# Patient Record
Sex: Female | Born: 2005 | Hispanic: Yes | Marital: Single | Smoking: Never smoker
Health system: Southern US, Community
[De-identification: ages and names within clinical notes are randomized; demographics above are authoritative.]

## PROBLEM LIST (undated history)

## (undated) HISTORY — PX: MYRINGOTOMY: SUR874

## (undated) HISTORY — PX: TONSILLECTOMY: SUR1361

---
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
## 2023-01-14 IMAGING — US US BREAST*R* LIMITED INC AXILLA
1 series · 7 of 7 positions shown · non-contrast
Comparison: None.

CLINICAL DATA: 15-year-old female presenting for evaluation of a
palpable lump in the upper-outer right breast. Her physician could
also feel the lump on clinical breast exam. She started birth
control within the past month.

EXAM:
ULTRASOUND OF THE RIGHT BREAST

[Series 1: us breast*right* limited inc axilla · 0.07mm/px · 7 of 7 slices shown]
[im 1/7]
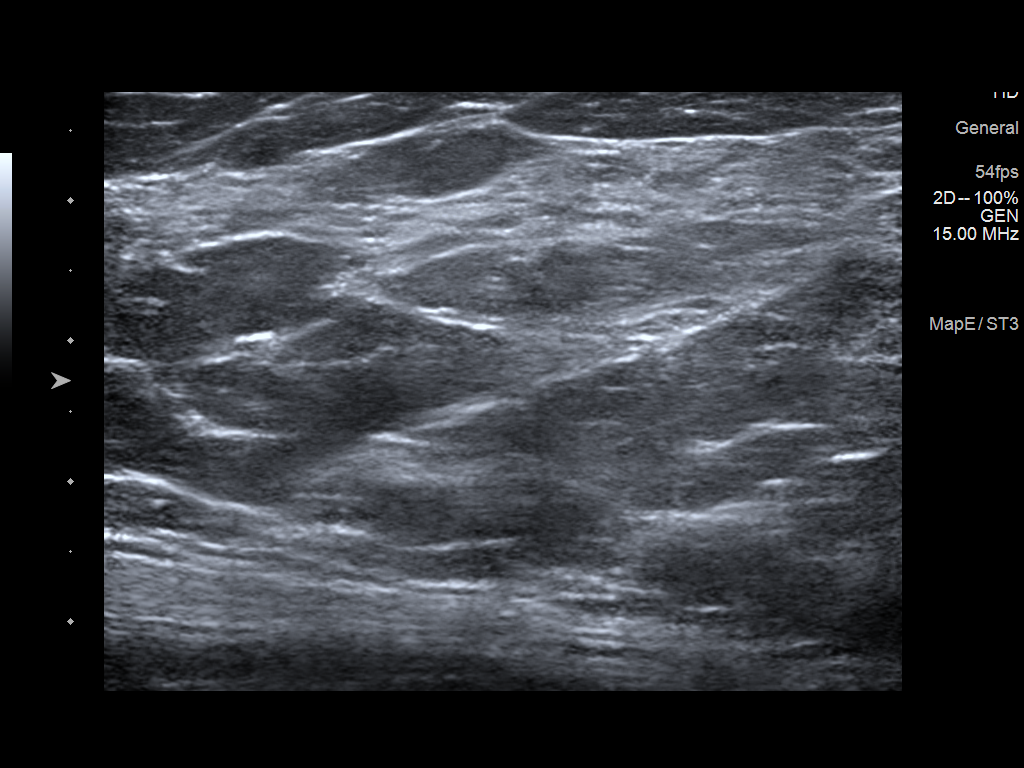
[im 2/7]
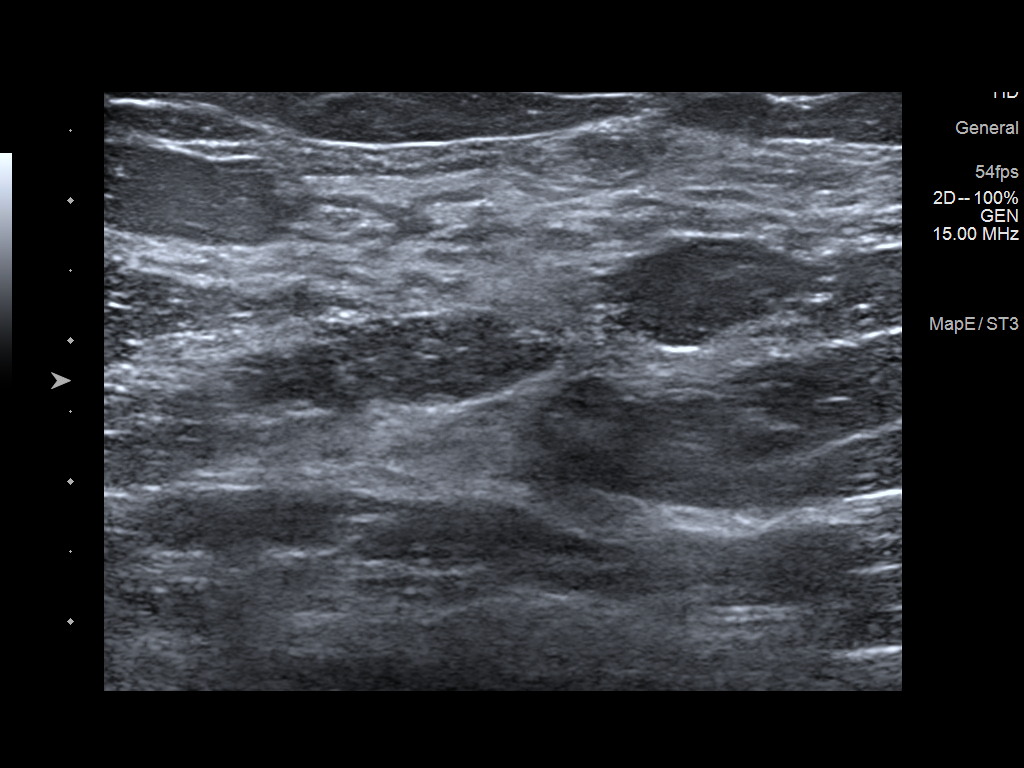
[im 3/7]
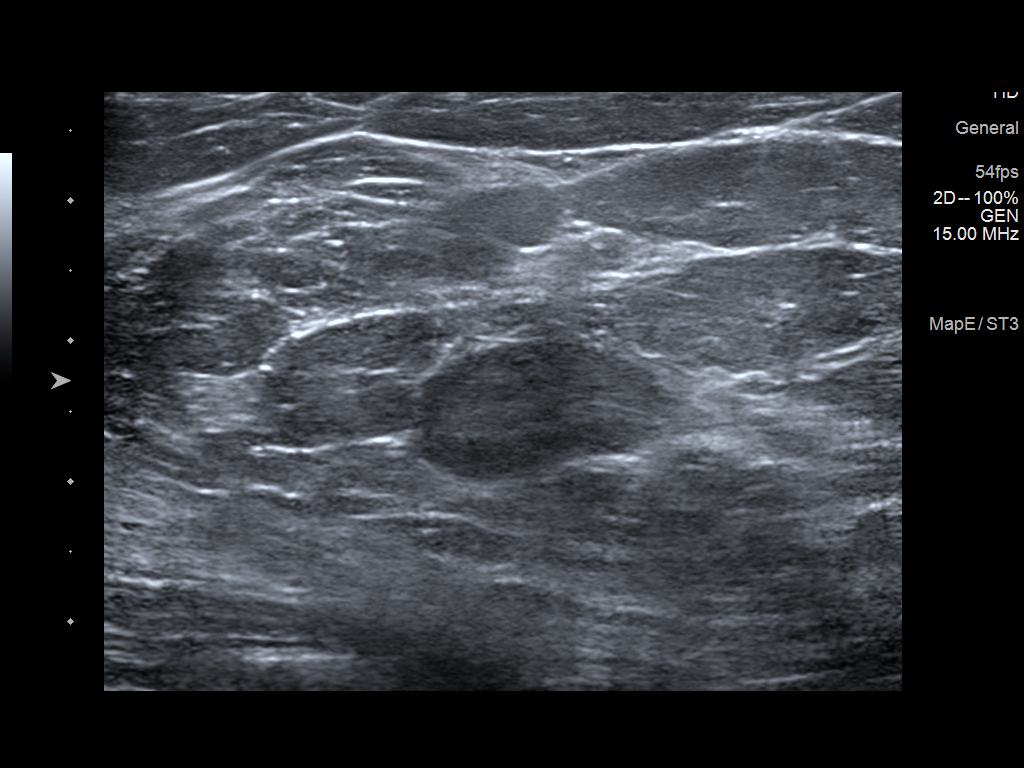
[im 4/7]
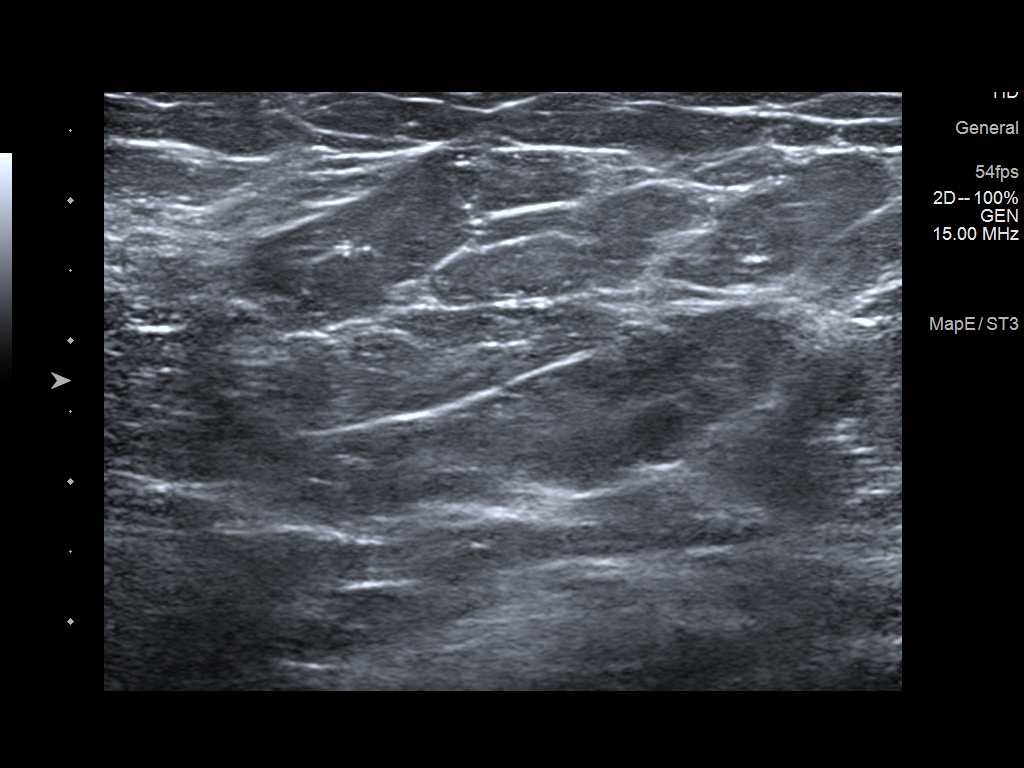
[im 5/7]
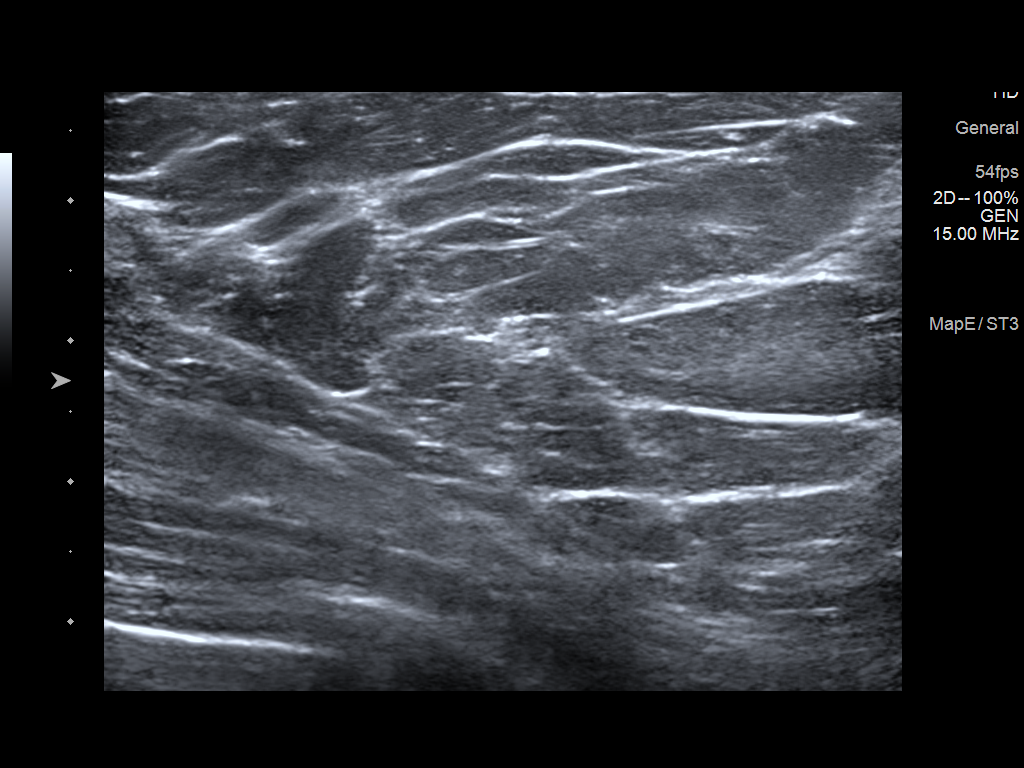
[im 6/7]
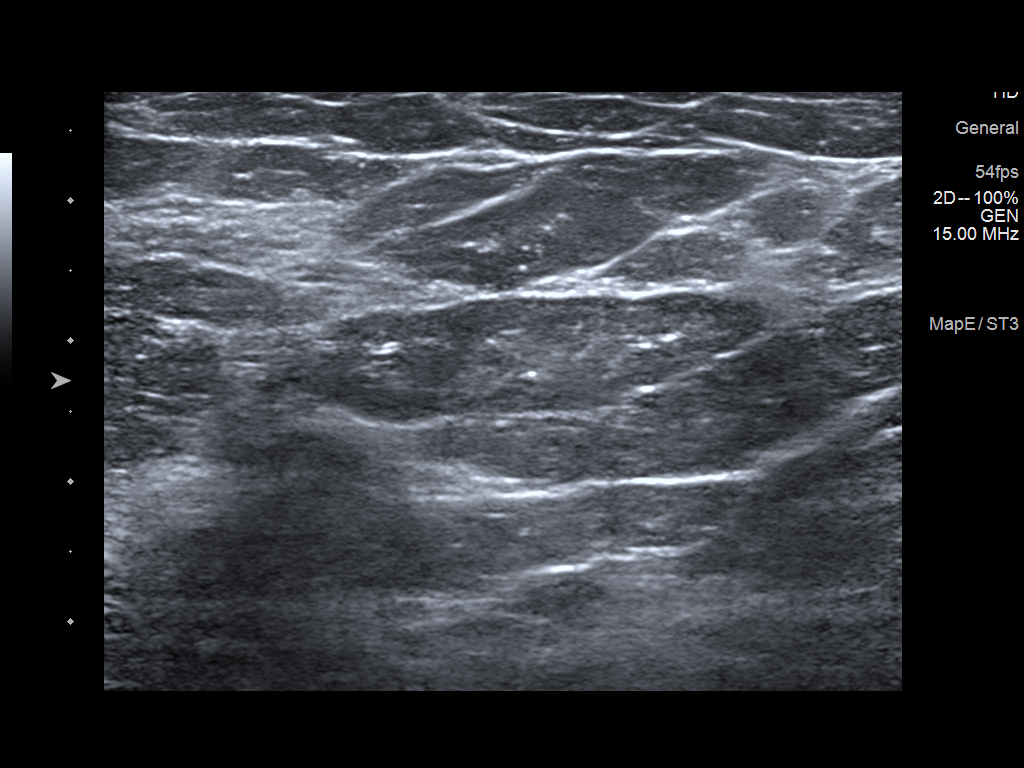
[im 7/7]
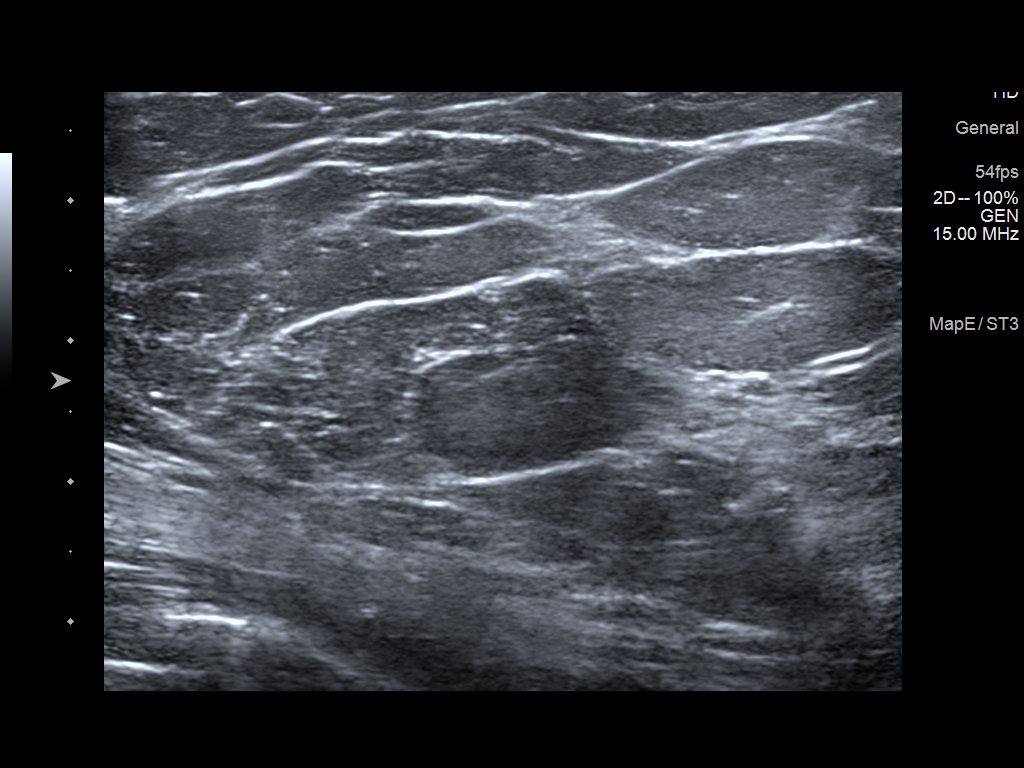

[7 of 7 positions shown; findings below may reference images not displayed]

FINDINGS: Ultrasound of the palpable lump in the upper-outer quadrant of the
right breast demonstrates normal fibroglandular tissue. No
suspicious masses or areas of shadowing are identified.
IMPRESSION: There are no targeted sonographic abnormalities at the palpable site
of concern in the upper-outer right breast.

RECOMMENDATION:
1. Clinical follow-up recommended for the palpable area of concern
in the right breast. Any further workup should be based on clinical
grounds.

2. Screening mammogram at age 40 unless there are persistent or
intervening clinical concerns. (Code:JY-T-HF5)

I have discussed the findings and recommendations with the patient.
If applicable, a reminder letter will be sent to the patient
regarding the next appointment.

BI-RADS CATEGORY  1: Negative.

## 2023-01-29 LAB — TSH: TSH: 4.93 mIU/L — ABNORMAL HIGH

## 2023-01-30 ENCOUNTER — Telehealth (INDEPENDENT_AMBULATORY_CARE_PROVIDER_SITE_OTHER): Payer: Self-pay | Admitting: Pediatrics

## 2023-01-30 ENCOUNTER — Encounter (INDEPENDENT_AMBULATORY_CARE_PROVIDER_SITE_OTHER): Payer: Self-pay | Admitting: Pediatrics

## 2023-01-30 ENCOUNTER — Ambulatory Visit (INDEPENDENT_AMBULATORY_CARE_PROVIDER_SITE_OTHER): Payer: Commercial Managed Care - PPO | Admitting: Pediatrics

## 2023-01-30 VITALS — BP 122/78 | HR 80 | Ht 64.13 in | Wt 181.7 lb

## 2023-01-30 DIAGNOSIS — E282 Polycystic ovarian syndrome: Secondary | ICD-10-CM

## 2023-01-30 DIAGNOSIS — E063 Autoimmune thyroiditis: Secondary | ICD-10-CM | POA: Diagnosis not present

## 2023-01-30 DIAGNOSIS — R197 Diarrhea, unspecified: Secondary | ICD-10-CM | POA: Diagnosis not present

## 2023-01-30 MED ORDER — LEVOTHYROXINE SODIUM 50 MCG PO TABS: 50.0000 ug | ORAL_TABLET | Freq: Every day | ORAL | 5 refills | Status: DC

## 2023-01-30 MED ORDER — DESOGESTREL-ETHINYL ESTRADIOL 0.15-30 MG-MCG PO TABS: 1.0000 | ORAL_TABLET | Freq: Every day | ORAL | 11 refills | Status: DC

## 2023-01-30 NOTE — Patient Instructions (Addendum)
Latest Reference Range & Units 07/13/22 13:58 01/28/23 15:29  TSH mIU/L 5.65 (H) 4.93 (H)  T4,Free(Direct) 0.8 - 1.4 ng/dL 1.2 1.1  (H): Data is abnormally high   Please obtain labs 2-3 weeks before the next visit or sooner if having symptoms, wait at least 6 weeks. Remember to get labs done BEFORE the dose of levothyroxine, or 6 hours AFTER the dose of levothyroxine.  [facility] is in our office Monday, Tuesday, Wednesday and Friday from 8AM-4PM, closed for lunch 12pm-1pm. On Thursday, you can go to the third floor, Pediatric Neurology office at [address] [place], [place] [address]. You do not need an appointment, as they see patients in the order they arrive.  Let the front staff know that you are here for labs, and they will help you get to the [facility] lab.

## 2023-01-30 NOTE — Assessment & Plan Note (Signed)
-  TSH rising, thyroxine normal -Increase levothyroxine daily, repeat TFTs in 6 weeks or if no symptoms 2-3 weeks before next visit

## 2023-01-30 NOTE — Assessment & Plan Note (Signed)
-  Stable -Continue apri

## 2023-01-30 NOTE — Assessment & Plan Note (Signed)
3 weeks of diarrhea concerning for gluten allergy. She has one autoimmune disease and is at risk of developing celiac disease -Celiac panel

## 2023-01-30 NOTE — Progress Notes (Signed)
Pediatric Endocrinology Consultation Follow-up Visit Laura Norton 2005-08-07 [id number] Iona Hansen, NP   HPI: Laura Norton  is a 17 y.o. female presenting for follow-up of Hypothyroidism and PCOS.  she is accompanied to this visit by her father. Interpreter present throughout the visit: No.  Reia was last seen at [facility] on 01/30/2023.  Since last visit, she has been taking levo half tablet daily and will miss a dose rarely, missed 4 times in total since last visit. She is also taking Apri with regular menses. 5 day cycle. Freshman in college.   ROS: Greater than 10 systems reviewed with pertinent positives listed in HPI, otherwise neg. 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  Meds: Current Outpatient Medications  Medication Instructions   amoxicillin-clavulanate (AUGMENTIN) 875-125 MG tablet 1 tablet, Oral, Every 12 hours   budesonide (RHINOCORT AQUA) 32 MCG/ACT nasal spray 1 spray, Each Nare, Daily   cetirizine (ZYRTEC ALLERGY) 10 mg, Oral, Daily at bedtime   desogestrel-ethinyl estradiol (APRI) 0.15-30 MG-MCG tablet 1 tablet, Oral, Daily   levothyroxine (SYNTHROID) 50 mcg, Oral, Daily   magnesium citrate SOLN 1 Bottle, Oral,  Once   montelukast (SINGULAIR) 10 mg, Oral, Daily at bedtime   VITAMIN D PO Oral    Allergies: Allergies  Allergen Reactions   Avocado Itching    Itching and burning   Banana Itching   Pecan Nut (Diagnostic) Itching    Surgical History: Past Surgical History:  Procedure Laterality Date   MYRINGOTOMY     TONSILLECTOMY      Family History: family history includes Cancer in her paternal grandmother; Cirrhosis in her maternal grandmother; Diabetes in her maternal grandfather, maternal grandmother,  mother, and paternal grandfather; Hypertension in her father and mother; Kidney disease in her paternal grandfather; Thyroid cancer in her maternal uncle.  Social History: Social History   Social History Narrative   She lives with parents, 2 dogs   She graduated from [employer] November 17, 2021. Working right now.    [employer] Degree  in arts   She enjoy singing, dancing and travel (favorite place is [place])      reports that she has never smoked. She has never used smokeless tobacco. She reports that she does not drink alcohol and does not use drugs.  Physical Exam:  Vitals:   01/30/23 1457  BP: 122/78  Pulse: 80  Weight: 181 lb 11.2 oz (82.4 kg)  Height: 5' 4.13" (1.629 m)   BP 122/78 (BP Location: Left Arm, Patient Position: Sitting, Cuff Size: Normal)   Pulse 80   Ht 5' 4.13" (1.629 m)   Wt 181 lb 11.2 oz (82.4 kg)   LMP 01/11/2023 (Exact Date)   BMI 31.06 kg/m  Body mass index: body mass index is 31.06 kg/m. Blood pressure %iles are not available for patients who are 18 years or older. 95 %ile (Z= 1.63) based on CDC (Girls, 2-20 Years) BMI-for-age based on BMI available on 01/30/2023.  Wt Readings from Last 3 Encounters:  01/30/23 181 lb 11.2 oz (82.4 kg) (95%, Z= 1.65)*  07/27/22 179 lb 12.8 oz (81.6 kg) (95%, Z= 1.64)*  05/28/22 175 lb (79.4 kg) (94%, Z= 1.56)*   * Growth percentiles are based on CDC (Girls, 2-20 Years) data.   Ht Readings from Last 3 Encounters:  01/30/23 5'  4.13" (1.629 m) (48%, Z= -0.06)*  05/28/22 5\' 4"  (1.626 m) (46%, Z= -0.10)*  11/24/21 5' 4.17" (1.63 m) (49%, Z= -0.02)*   * Growth percentiles are based on CDC (Girls, 2-20 Years) data.   Physical Exam Vitals reviewed.  Constitutional:      Appearance: Normal appearance. She is not toxic-appearing.  HENT:     Head: Normocephalic and atraumatic.     Nose: Nose normal.     Mouth/Throat:     Mouth: Mucous membranes are moist.  Eyes:     Extraocular Movements: Extraocular movements  intact.  Neck:     Comments: No goiter, cobblestoning, no nodule Cardiovascular:     Pulses: Normal pulses.  Pulmonary:     Effort: Pulmonary effort is normal. No respiratory distress.  Abdominal:     General: There is no distension.  Musculoskeletal:        General: Normal range of motion.     Cervical back: Normal range of motion and neck supple.  Skin:    General: Skin is warm.     Capillary Refill: Capillary refill takes less than 2 seconds.  Neurological:     General: No focal deficit present.     Mental Status: She is alert.     Comments: No tremor  Psychiatric:        Mood and Affect: Mood normal.        Behavior: Behavior normal.      Labs: Results for orders placed or performed in visit on 07/27/22  T4, free  Result Value Ref Range   Free T4 1.1 0.8 - 1.4 ng/dL  TSH  Result Value Ref Range   TSH 4.93 (H) mIU/L    Assessment/Plan: Colbee is a 17 y.o. female with The primary encounter diagnosis was Chronic lymphocytic thyroiditis. Diagnoses of PCOS (polycystic ovarian syndrome) and Diarrhea, unspecified type were also pertinent to this visit.  Nhia was seen today for chronic lymphocytic thyroiditis.  Chronic lymphocytic thyroiditis Overview: chronic lymphocytic thyroiditis treated with low dose levothyroxine started 11/24/21 (01/24/21 TPO Ab >900 and TH antibodies 474), with associated goiter, and history of elevated TSH found during evaluation by gynecologist for irregular menses with associated goiter. Laura Norton established care with this practice 01/23/21.   Assessment & Plan: -TSH rising, thyroxine normal -Increase levothyroxine daily, repeat TFTs in 6 weeks or if no symptoms 2-3 weeks before next visit   Orders: -     Levothyroxine Sodium; Take 1 tablet (50 mcg total) by mouth daily.  Dispense: 30 tablet; Refill: 5 -     T4, free -     TSH  PCOS (polycystic ovarian syndrome) Overview: She also has PCOS treated with  metformin that was discontinued 11/24/21 as free testosterone had normalized. Pelvic ultrasound in August 2022 was normal. She has tried multiple OCPs prescribed by me and OBGYN as we are trying to find an OCP that does not worsen her depression, and [facility] is working. She established care 01/23/2021.    Assessment & Plan: -Stable -Continue apri  Orders: -     Desogestrel-Ethinyl Estradiol; Take 1 tablet by mouth daily.  Dispense: 28 tablet; Refill: 11  Diarrhea, unspecified type Assessment & Plan: 3 weeks of diarrhea concerning for gluten allergy. She has one autoimmune disease and is at risk of developing celiac disease -Celiac panel  Orders: -     Celiac Disease Comprehensive Panel with Reflexes    Patient Instructions    Latest Reference Range & Units 07/13/22 13:58  01/28/23 15:29  TSH mIU/L 5.65 (H) 4.93 (H)  T4,Free(Direct) 0.8 - 1.4 ng/dL 1.2 1.1  (H): Data is abnormally high   Please obtain labs 2-3 weeks before the next visit or sooner if having symptoms, wait at least 6 weeks. Remember to get labs done BEFORE the dose of levothyroxine, or 6 hours AFTER the dose of levothyroxine.  [facility] is in our office Monday, Tuesday, Wednesday and Friday from 8AM-4PM, closed for lunch 12pm-1pm. On Thursday, you can go to the third floor, Pediatric Neurology office at [address], [place], [place] [address]. You do not need an appointment, as they see patients in the order they arrive.  Let the front staff know that you are here for labs, and they will help you get to the [facility].    Follow-up:   Return in about 4 months (around 06/01/2023) for to review studies, follow up.  Medical decision-making:  I have personally spent 30 minutes involved in face-to-face and non-face-to-face activities for this patient on the day of the visit. Professional time spent includes the following activities, in addition to those noted in the documentation: preparation time/chart review, ordering of  medications/tests/procedures, obtaining and/or reviewing separately obtained history, counseling and educating the patient/family/caregiver, performing a medically appropriate examination and/or evaluation, referring and communicating with other health care professionals for care coordination, and documentation in the EHR.  Thank you for the opportunity to participate in the care of your patient. Please do not hesitate to contact me should you have any questions regarding the assessment or treatment plan.   Sincerely,   Silvana Newness, MD

## 2023-01-30 NOTE — Telephone Encounter (Signed)
Spoke to patient, advised labs are back and Dr. Quincy Sheehan will review them at todays visit, also ask her to set up a Mychart account.

## 2023-01-30 NOTE — Telephone Encounter (Signed)
Who's calling (name and relationship to patient) : Laura Norton; self  Best contact number: [phone]  Provider they see: Dr. Quincy Sheehan   Reason for call: [name] wants to confirm if labs were received prior to appt. She stated that she normally has to go at least a week before appt and recently went 2 days ago. She wants to make sure labs are in to go over for today's appt.    Call ID:      PRESCRIPTION REFILL ONLY  Name of prescription:  Pharmacy:

## 2023-05-06 ENCOUNTER — Telehealth (INDEPENDENT_AMBULATORY_CARE_PROVIDER_SITE_OTHER): Payer: Self-pay | Admitting: Pediatrics

## 2023-05-06 NOTE — Telephone Encounter (Signed)
Patient called while at pharmacy with difficulties obtaining medication. Pharmacist resolved issue while we were on the phone.

## 2023-05-31 ENCOUNTER — Other Ambulatory Visit (INDEPENDENT_AMBULATORY_CARE_PROVIDER_SITE_OTHER): Payer: Self-pay | Admitting: Pediatrics

## 2023-05-31 DIAGNOSIS — E063 Autoimmune thyroiditis: Secondary | ICD-10-CM

## 2023-06-03 ENCOUNTER — Ambulatory Visit (INDEPENDENT_AMBULATORY_CARE_PROVIDER_SITE_OTHER): Payer: Commercial Managed Care - PPO | Admitting: Pediatrics

## 2023-06-03 ENCOUNTER — Telehealth (INDEPENDENT_AMBULATORY_CARE_PROVIDER_SITE_OTHER): Payer: Self-pay | Admitting: Pediatrics

## 2023-06-03 NOTE — Telephone Encounter (Signed)
  Name of who is calling:  Angela  Caller's Relationship to Patient: self  Best contact number: [phone]  Provider they see: Quincy Sheehan  Reason for call: Calling for refill, the pharmacy states the refill was denied thru [place] and they can't refill it. She only has a few left before she is out. Please contact back in ref to this     PRESCRIPTION REFILL ONLY  Name of prescription:  Pharmacy:

## 2023-06-03 NOTE — Telephone Encounter (Signed)
Patient called back, explained that pharmacy was trying to fill the 75 mcg and she should be on 50 mcg since her last appointment.  Reviewed las progress note and read plan to her.  She also has not gotten her labs.  At first she said she was on 37.5 mcg, I told her to start the 50 mcg and get her labs in 6 weeks (1 week prior to follow up scheduled on 2/10)  She then said she was taking the 50 mcg.  I  reiterated she needed her labwork and to request a refill on the 50 mcg levothyroxine from the pharmacy.  She verbalized understanding.  She asked what days we were open this week.  I let her know that we are open half day tomorrow, thurs and Friday.  There are no providers in office but there is staff to answer the phone.  I also let her know that I am out of the office until 1/7 and Dr Quincy Sheehan has several days that she is out til [facility] January.  She verbalized understanding.

## 2023-06-03 NOTE — Telephone Encounter (Signed)
Attempted to call patient, left HIPAA approved VM that they tried to refill the wrong dose to please call me back at [phone].

## 2023-06-28 LAB — TSH: TSH: 1.42 m[IU]/L

## 2023-06-28 LAB — T4, FREE: Free T4: 1.5 ng/dL — ABNORMAL HIGH (ref 0.8–1.4)

## 2023-07-22 ENCOUNTER — Encounter (INDEPENDENT_AMBULATORY_CARE_PROVIDER_SITE_OTHER): Payer: Self-pay | Admitting: Pediatrics

## 2023-07-22 ENCOUNTER — Ambulatory Visit (INDEPENDENT_AMBULATORY_CARE_PROVIDER_SITE_OTHER): Payer: Commercial Managed Care - PPO | Admitting: Pediatrics

## 2023-07-22 VITALS — BP 122/68 | HR 68 | Ht 64.17 in | Wt 178.2 lb

## 2023-07-22 DIAGNOSIS — Z7187 Encounter for pediatric-to-adult transition counseling: Secondary | ICD-10-CM

## 2023-07-22 DIAGNOSIS — E063 Autoimmune thyroiditis: Secondary | ICD-10-CM

## 2023-07-22 DIAGNOSIS — R197 Diarrhea, unspecified: Secondary | ICD-10-CM

## 2023-07-22 DIAGNOSIS — E282 Polycystic ovarian syndrome: Secondary | ICD-10-CM

## 2023-07-22 MED ORDER — DESOGESTREL-ETHINYL ESTRADIOL 0.15-30 MG-MCG PO TABS: 1.0000 | ORAL_TABLET | Freq: Every day | ORAL | 11 refills | Status: DC

## 2023-07-22 MED ORDER — LEVOTHYROXINE SODIUM 50 MCG PO TABS: 50.0000 ug | ORAL_TABLET | Freq: Every day | ORAL | 11 refills | Status: AC

## 2023-07-22 NOTE — Progress Notes (Signed)
 Pediatric Endocrinology Consultation Follow-up Visit Laura Norton 02-27-2006 [id number] Angelia Kelp, NP   HPI: Laura Norton  is a 18 y.o. female presenting for follow-up of PCOS and Hypothyroidism. She is accompanied by her mother. Interpreter present throughout the visit: No.  Kd was last seen at [facility] on 01/30/2023.  Since last visit, she had PNA 1 month ago. Scarlett Saavedra Velez has been taking levo 50mcg with no missed doses. There has been no heat/cold intolerance, constipation/diarrhea, tremor, mood changes, poor energy, fatigue, dry skin, brittle hair/hair loss, nor changes in menses. Taking Apri . Celiac was in process, but not resulted after a month.   ROS: Greater than 10 systems reviewed with pertinent positives listed in HPI, otherwise neg. 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  Meds: Current Outpatient Medications  Medication Instructions   budesonide  (RHINOCORT  AQUA) 32 MCG/ACT nasal spray 1 spray, Each Nare, Daily   cetirizine  (ZYRTEC  ALLERGY) 10 mg, Oral, Daily at bedtime   desogestrel -ethinyl estradiol  (APRI ) 0.15-30 MG-MCG tablet 1 tablet, Oral, Daily   levothyroxine  (SYNTHROID ) 50 mcg, Oral, Daily   magnesium citrate SOLN 1 Bottle,  Once   montelukast  (SINGULAIR ) 10 mg, Oral, Daily at bedtime   VITAMIN D PO Take by mouth.    Allergies: Allergies  Allergen Reactions   Avocado Itching    Itching and burning   Banana Itching   Pecan Nut (Diagnostic) Itching    Surgical History: Past Surgical History:  Procedure Laterality Date   MYRINGOTOMY     TONSILLECTOMY      Family History: family history includes Cancer in her paternal grandmother; Cirrhosis in her maternal grandmother; Diabetes in her maternal  grandfather, maternal grandmother, mother, and paternal grandfather; Hypertension in her father and mother; Kidney disease in her paternal grandfather; Thyroid  cancer in her maternal uncle.  Social History: Social History   Social History Narrative   She lives with parents, 2 dogs   She graduated from [employer] November 17, 2021. Working right now.    [employer] Degree  in arts   She enjoy singing, dancing and travel (favorite place is [place])      reports that she has never smoked. She has never used smokeless tobacco. She reports that she does not drink alcohol and does not use drugs.  Physical Exam:  Vitals:   07/22/23 1344  BP: 122/68  Pulse: 68  Weight: 178 lb 3.2 oz (80.8 kg)  Height: 5' 4.17" (1.63 m)   BP 122/68   Pulse 68   Ht 5' 4.17" (1.63 m)   Wt 178 lb 3.2 oz (80.8 kg)   LMP 07/03/2023   BMI 30.42 kg/m  Body mass index: body mass index is 30.42 kg/m. Blood pressure %iles are not available for patients who are 18 years or older. 94 %ile (Z= 1.54) based on CDC (Girls, 2-20 Years) BMI-for-age based on BMI available on 07/22/2023.  Wt Readings from Last 3 Encounters:  07/22/23 178 lb 3.2 oz (80.8 kg) (94%, Z= 1.56)*  01/30/23 181 lb 11.2 oz (82.4 kg) (95%, Z= 1.65)*  07/27/22 179 lb 12.8 oz (81.6 kg) (95%, Z= 1.64)*   * Growth percentiles are based on CDC (Girls, 2-20 Years) data.   Ht Readings from Last 3 Encounters:  07/22/23 5' 4.17" (1.63 m) (48%, Z= -0.05)*  01/30/23 5' 4.13" (1.629  m) (48%, Z= -0.06)*  05/28/22 5\' 4"  (1.626 m) (46%, Z= -0.10)*   * Growth percentiles are based on CDC (Girls, 2-20 Years) data.   Physical Exam Vitals reviewed.  Constitutional:      Appearance: Normal appearance. She is not toxic-appearing.  HENT:     Head: Normocephalic and atraumatic.     Nose: Nose normal.     Mouth/Throat:     Mouth: Mucous membranes are moist.  Eyes:     Extraocular Movements: Extraocular movements intact.  Neck:     Comments: No goiter,  cobblestoning, no nodule Cardiovascular:     Pulses: Normal pulses.  Pulmonary:     Effort: Pulmonary effort is normal. No respiratory distress.  Abdominal:     General: There is no distension.  Musculoskeletal:        General: Normal range of motion.     Cervical back: Normal range of motion and neck supple.  Skin:    General: Skin is warm.     Capillary Refill: Capillary refill takes less than 2 seconds.  Neurological:     General: No focal deficit present.     Mental Status: She is alert.     Comments: No tremor  Psychiatric:        Mood and Affect: Mood normal.        Behavior: Behavior normal.      Labs: Results for orders placed or performed in visit on 01/30/23  T4, free   Collection Time: 06/27/23  3:17 PM  Result Value Ref Range   Free T4 1.5 (H) 0.8 - 1.4 ng/dL  TSH   Collection Time: 06/27/23  3:17 PM  Result Value Ref Range   TSH 1.42 mIU/L    Assessment/Plan: Tanji was seen today for chronic lymphocytic thyroiditis.  Chronic lymphocytic thyroiditis Overview: chronic lymphocytic thyroiditis treated with low dose levothyroxine  started 11/24/21 (01/24/21 TPO Ab >900 and TH antibodies 474), with associated goiter, and history of elevated TSH found during evaluation by gynecologist for irregular menses with associated goiter. Araceli Weng established care with this practice 01/23/21.   Assessment & Plan: -clinically euthyroid -TSH normal, thyroxine at upper end of normal as labs obtained after AM dose -Continue levothyroxine  50mcg daily, refills provided x 1 year -TFTs before next visit, transition to adult endocrinology  Orders: -     Levothyroxine  Sodium; Take 1 tablet (50 mcg total) by mouth daily.  Dispense: 30 tablet; Refill: 11 -     Ambulatory referral to Endocrinology -     T4, free; Future -     TSH; Future  PCOS (polycystic ovarian syndrome) Overview: She also has PCOS treated with metformin  that was discontinued 11/24/21 as free  testosterone  had normalized. Pelvic ultrasound in August 2022 was normal. She has tried multiple OCPs prescribed by me and OBGYN as we are trying to find an OCP that does not worsen her depression, and Apri  is working. She established care 01/23/2021.    Assessment & Plan: -Stable -last Testosterone  level was normal -Continue apri   Orders: -     Desogestrel -Ethinyl Estradiol ; Take 1 tablet by mouth daily.  Dispense: 28 tablet; Refill: 11 -     Ambulatory referral to Endocrinology  Counseling for transition from pediatric to adult care provider -     Ambulatory referral to Endocrinology  Diarrhea, unspecified type Assessment & Plan: -diarrhea continues -celiac panel says in process -recommend PCP to obtain repeat celiac panel if it TNPs     Patient Instructions  Latest Reference Range & Units 01/28/23 15:29 06/27/23 15:17  TSH mIU/L 4.93 (H) 1.42  T4,Free(Direct) 0.8 - 1.4 ng/dL 1.1 1.5 (H)  (H): Data is abnormally high  Medication: continue levothyroxine  50mcg daily   Laboratory studies:  Please obtain labs 1-2 days before the next visit. Remember to get labs done BEFORE the dose of levothyroxine , or 6 hours AFTER the dose of levothyroxine .  [facility] is in our office Monday, Tuesday, Wednesday and Friday from 8AM-4PM, closed for lunch 12pm-1pm. On Thursday, you can go to the third floor, Pediatric Neurology office at [address] [place], [place] [address]. You do not need an appointment, as they see patients in the order they arrive.  Let the front staff know that you are here for labs, and they will help you get to the [facility] lab.     Follow-up:   Return in about 1 year (around 07/21/2024).  Medical decision-making:  I have personally spent 31 minutes involved in face-to-face and non-face-to-face activities for this patient on the day of the visit. Professional time spent includes the following activities, in addition to those noted in the documentation: preparation time/chart  review, ordering of medications/tests/procedures, obtaining and/or reviewing separately obtained history, counseling and educating the patient/family/caregiver, performing a medically appropriate examination and/or evaluation, referring and communicating with other health care professionals for care coordination,  and documentation in the EHR.  Thank you for the opportunity to participate in the care of your patient. Please do not hesitate to contact me should you have any questions regarding the assessment or treatment plan.   Sincerely,   Maryjo Snipe, MD

## 2023-07-22 NOTE — Assessment & Plan Note (Signed)
-  diarrhea continues -celiac panel says in process -recommend PCP to obtain repeat celiac panel if it TNPs

## 2023-07-22 NOTE — Assessment & Plan Note (Signed)
-  clinically euthyroid -TSH normal, thyroxine at upper end of normal as labs obtained after AM dose -Continue levothyroxine  50mcg daily, refills provided x 1 year -TFTs before next visit, transition to adult endocrinology

## 2023-07-22 NOTE — Assessment & Plan Note (Signed)
-  Stable -last Testosterone  level was normal -Continue apri

## 2023-07-22 NOTE — Patient Instructions (Signed)
 Latest Reference Range & Units 01/28/23 15:29 06/27/23 15:17  TSH mIU/L 4.93 (H) 1.42  T4,Free(Direct) 0.8 - 1.4 ng/dL 1.1 1.5 (H)  (H): Data is abnormally high  Medication: continue levothyroxine  50mcg daily   Laboratory studies:  Please obtain labs 1-2 days before the next visit. Remember to get labs done BEFORE the dose of levothyroxine , or 6 hours AFTER the dose of levothyroxine .  [facility] is in our office Monday, Tuesday, Wednesday and Friday from 8AM-4PM, closed for lunch 12pm-1pm. On Thursday, you can go to the third floor, Pediatric Neurology office at [address] [place], [place] [address]. You do not need an appointment, as they see patients in the order they arrive.  Let the front staff know that you are here for labs, and they will help you get to the [facility].

## 2023-11-19 ENCOUNTER — Telehealth (INDEPENDENT_AMBULATORY_CARE_PROVIDER_SITE_OTHER): Payer: Self-pay | Admitting: Pediatrics

## 2023-11-19 NOTE — Telephone Encounter (Signed)
 Reviewed last progress note, per Dr. Ames Bakes she is also followed by GYN and refers the brand Apri .  Returned call to patient, let patient know that Dr. Ames Bakes was out of the office until Thurs, upon confirming brand, phone was disconnected.  Returned call to patient, confirmed brand is Apri , suggested that she reach out to her GYN as well for further advise.  She stated that she has also left.  I told her she could call other pharmacies to see if they have this preferred brand in stock and they can transfer her script to them from the current pharmacy.  She then asked if I could advise her on using a different brand, I explained that I could not and recommended that she call her GYN office nurse line to see if they could further advise her on that.  She stated that she will call other pharmacies but would like a call back from Dr. Ames Bakes for further guidance.  I told her I will route this to her upon her return.  She verbalized understanding.

## 2023-11-19 NOTE — Telephone Encounter (Signed)
  Name of who is calling: Laura Norton   Caller's Relationship to Patient:   Best contact number: [phone]  Provider they see: Lorelee Roger   Reason for call: called in stating that she is needing help with her medication until she is seen at adult endo. She says she went and picked up her birth control from [employer] [facility], but they ended up giving her a different brand. She told the pharmacy she doesn't usually take the brand that was given to her, they informed her that it was practically the same medication they don't carry the original one anymore. She is wanting a call back to discuss if its ok to take or not.      PRESCRIPTION REFILL ONLY  Name of prescription:  Pharmacy:

## 2023-11-27 ENCOUNTER — Encounter: Payer: Self-pay | Admitting: Allergy

## 2023-11-27 ENCOUNTER — Other Ambulatory Visit: Payer: Self-pay

## 2023-11-27 ENCOUNTER — Ambulatory Visit: Admitting: Allergy

## 2023-11-27 VITALS — BP 102/80 | HR 83 | Temp 98.2°F | Resp 20 | Ht 63.78 in | Wt 181.5 lb

## 2023-11-27 DIAGNOSIS — T781XXD Other adverse food reactions, not elsewhere classified, subsequent encounter: Secondary | ICD-10-CM | POA: Diagnosis not present

## 2023-11-27 DIAGNOSIS — L2089 Other atopic dermatitis: Secondary | ICD-10-CM

## 2023-11-27 DIAGNOSIS — J4520 Mild intermittent asthma, uncomplicated: Secondary | ICD-10-CM | POA: Diagnosis not present

## 2023-11-27 DIAGNOSIS — J3089 Other allergic rhinitis: Secondary | ICD-10-CM

## 2023-11-27 MED ORDER — EPINEPHRINE 0.3 MG/0.3ML IJ SOAJ: 0.3000 mg | INTRAMUSCULAR | 1 refills | Status: AC | PRN

## 2023-11-27 MED ORDER — DESONIDE 0.05 % EX OINT: 1.0000 | TOPICAL_OINTMENT | Freq: Two times a day (BID) | CUTANEOUS | 2 refills | Status: AC | PRN

## 2023-11-27 MED ORDER — ALBUTEROL SULFATE HFA 108 (90 BASE) MCG/ACT IN AERS: 2.0000 | INHALATION_SPRAY | RESPIRATORY_TRACT | 1 refills | Status: AC | PRN

## 2023-11-27 NOTE — Progress Notes (Signed)
 New Patient Note  RE: Laura Norton MRN: [id number] DOB: 2005-10-17 Date of Office Visit: 11/27/2023  Consult requested by: Angelia Kelp, NP Primary care provider: Angelia Kelp, NP  Chief Complaint: Establish Care (Pt reports that when she gets sick, prescribed an inhaler never been dx asthma. Pt reports sensitivity to cat dander. Exercising brings on the cough and wheezing feeling. )  History of Present Illness: I had the pleasure of seeing Laura Norton for initial evaluation at the [facility] on 11/27/2023. She is a 18 y.o. female, who is referred here by Angelia Kelp, NP for the evaluation of asthma and allergies.  Discussed the use of AI scribe software for clinical note transcription with the patient, who gave verbal consent to proceed.    She has a history of food allergies to pecans, avocados, bananas, and black cherries, causing itching of the tongue, mouth, and ears, occasional lip swelling, and pain. Reactions vary in severity and can occur with both raw and processed foods. Symptoms typically resolve within 30 minutes to an hour, and she sometimes takes Benadryl for relief. No prior allergy testing has been conducted.  She experiences environmental allergy symptoms such as congestion, rhinorrhea, and frequent nose blowing, which are present year-round but worsen slightly during allergy seasons. These symptoms have been ongoing for about three years. She occasionally uses Zyrtec  and a nasal spray for relief and frequently uses Vicks VapoRub.  She has a history of wheezing and cough, particularly with exertion or after eating thick foods, and describes feeling 'phlegmy' after consuming certain foods. She has never been diagnosed with asthma but has been prescribed inhalers in the past, which she has not used. Recently, she developed a cough and wheezing without other symptoms of illness.  She reports occasional dry patches on her scalp  and eyelids, which were treated with a prescribed shampoo and resolved after a few weeks of use. She has not experienced these symptoms in recent months.  She has a history of hypothyroidism and polycystic ovary syndrome (PCOS). She takes levothyroxine  for hypothyroidism and birth control for PCOS.   Past work up includes: none. Dietary History: patient has been eating other foods including milk, eggs, peanuts, treenuts, sesame, shellfish, fish, soy, wheat, meats, fruits and vegetables.    She reports symptoms of nasal congestion, rhinorrhea. Symptoms have been going on for many years. The symptoms are present all year around. She has used zyrtec  prn, Flonase with some improvement in symptoms. Sinus infections: no. Previous work up includes: none. Previous ENT evaluation: no, no prior sinus surgery. Last eye exam: this year. History of reflux: sometimes.  Assessment and Plan: Rebeka is a 18 y.o. female with: Other adverse food reactions, not elsewhere classified, subsequent encounter Oral allergy syndrome, subsequent encounter Reactions to pecans, avocados, bananas, and black cherries with pruritus, lip swelling, and pain. No prior allergy testing or EpiPen. Continue to avoid foods that are bothersome - pecans, avocado, bananas, cherries. I have prescribed epinephrine injectable device and demonstrated proper use. For mild symptoms you can take over the counter antihistamines (zyrtec  10mg  to 20mg ) and monitor symptoms closely.  If symptoms worsen or if you have severe symptoms including breathing issues, throat closure, significant swelling, whole body hives, severe diarrhea and vomiting, lightheadedness then use epinephrine and seek immediate medical care afterwards. Emergency action plan given. Plan on select food testing at next visit.   Other allergic rhinitis Chronic nasal congestion and rhinorrhea, exacerbated during allergy  seasons. No sinus infections this year. Return for allergy  skin testing. Will make additional recommendations based on results. Use Flonase (fluticasone) nasal spray 1-2 sprays per nostril once a day as needed for nasal congestion.  Nasal saline spray (i.e., Simply Saline) or nasal saline lavage (i.e., NeilMed) is recommended as needed and prior to medicated nasal sprays. Use over the counter antihistamines such as Zyrtec  (cetirizine ), Claritin (loratadine), Allegra (fexofenadine), or Xyzal (levocetirizine) daily as needed. May take twice a day during allergy flares. May switch antihistamines every few months.  Reactive airway disease, mild intermittent, uncomplicated Episodes of coughing and wheezing, especially with exertion, during infections possibly postprandial.  Today's spirometry was normal. Monitor symptoms. May use albuterol rescue inhaler 2 puffs every 4 to 6 hours as needed for shortness of breath, chest tightness, coughing, and wheezing. May use albuterol rescue inhaler 2 puffs 5 to 15 minutes prior to strenuous physical activities. Monitor frequency of use - if you need to use it more than twice per week on a consistent basis let us  know.  Consider trial of PPI if still complains of postprandial symptoms.  Other atopic dermatitis Mainly on eyelids. Keep track of rashes and take pictures. See below for proper skin care. Use fragrance free and dye free products. No dryer sheets or fabric softener.   Use desonide 0.05% ointment twice a day as needed for mild rash flares - okay to use on the face, neck, groin area. Do not use more than 1 week at a time.  Return for Skin testing.  Meds ordered this encounter  Medications   albuterol (VENTOLIN HFA) 108 (90 Base) MCG/ACT inhaler    Sig: Inhale 2 puffs into the lungs every 4 (four) hours as needed for wheezing or shortness of breath (coughing fits).    Dispense:  18 g    Refill:  1   desonide (DESOWEN) 0.05 % ointment    Sig: Apply 1 Application topically 2 (two) times daily as needed  (mild rash flare). Okay to use on the face, neck, groin area. Do not use more than 1 week at a time.    Dispense:  60 g    Refill:  2   EPINEPHrine 0.3 mg/0.3 mL IJ SOAJ injection    Sig: Inject 0.3 mg into the muscle as needed for anaphylaxis.    Dispense:  2 each    Refill:  1    May dispense generic/Mylan/Teva brand.   Lab Orders  No laboratory test(s) ordered today    Other allergy screening: Asthma:  Used to flare with infections. Some coughing and wheezing with exertion.   Medication allergy: no Hymenoptera allergy: no Urticaria: no Eczema:no History of recurrent infections suggestive of immunodeficency: no  Diagnostics: Spirometry:  Tracings reviewed. Her effort: Good reproducible efforts. FVC: 4.08L FEV1: 3.37L, 116% predicted FEV1/FVC ratio: 83% Interpretation: Spirometry consistent with normal pattern.  Please see scanned spirometry results for details.  Results discussed with patient/family.   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 Robecca Fulgham established care with this practice 01/23/21.      PCOS (polycystic ovarian syndrome) 02/09/2021   She also has PCOS treated with metformin   that was discontinued 11/24/21 as free testosterone  had normalized. Pelvic ultrasound in August 2022 was normal. She has tried multiple OCPs prescribed by me and OBGYN as we are trying to find an OCP that does not worsen her depression, and Apri  is working. She established care 01/23/2021.       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 18 g 1   budesonide  (RHINOCORT  AQUA) 32 MCG/ACT nasal spray Place 1 spray into both nostrils daily. 8.43 mL 0   cetirizine  (ZYRTEC  ALLERGY) 10 MG tablet Take 1 tablet (10 mg total) by mouth at bedtime. 90 tablet 1   desogestrel -ethinyl estradiol  (APRI ) 0.15-30 MG-MCG tablet Take 1 tablet by mouth daily. 28 tablet 11   desonide (DESOWEN) 0.05 % ointment Apply 1 Application topically 2 (two) times daily as needed (mild rash flare). Okay to use on the face, neck, groin area. Do not use more than 1 week at a time. 60 g 2   EPINEPHrine 0.3 mg/0.3 mL IJ SOAJ injection Inject 0.3 mg into the muscle as needed for anaphylaxis. 2 each 1   levothyroxine  (SYNTHROID ) 50 MCG tablet Take 1 tablet (50 mcg total) by mouth daily. 30 tablet 11   magnesium citrate SOLN Take 1 Bottle by mouth once.     VITAMIN D PO Take by mouth.     No current facility-administered medications for this visit.   Allergies: Allergies  Allergen Reactions   Avocado Itching    Itching and burning   Banana Itching   Pecan Extract Itching   Pecan Nut (Diagnostic) Itching   Social History: Social History   Socioeconomic History   Marital status: Single    Spouse name: Not on file   Number of children: Not on file   Years of education: Not  on file   Highest education level: Not on file  Occupational History   Not on file  Tobacco Use   Smoking status: Never    Passive exposure: Never   Smokeless tobacco: Never  Vaping Use   Vaping status: Never Used  Substance and Sexual Activity   Alcohol use: No   Drug use: No   Sexual activity: Not on file  Other Topics Concern   Not on file  Social History Narrative   She lives with parents, 2 dogs   She graduated from [employer] November 17, 2021. Working right now.    [employer] Degree  in arts   She enjoy singing, dancing and travel (favorite place is [place])    Social Drivers of Corporate investment banker Strain: Not on file  Food Insecurity: Not on file  Transportation Needs: Not on file  Physical Activity: Not on file  Stress: Not on file  Social Connections: Not on file   Lives in  a house. Smoking: denies Occupation: Public librarian History: Immunologist in the house: no Engineer, civil (consulting) in the family room: no Carpet in the bedroom: no Heating: electric Cooling: central Pet: yes 2 dogs  Family History: Family History  Problem Relation Age of Onset   Diabetes Mother        gestational diabetes   Hypertension Mother    Hypertension Father    Thyroid  cancer Maternal Uncle    Diabetes Maternal Grandmother    Cirrhosis Maternal Grandmother        not from alocholism   Diabetes Maternal Grandfather    Cancer Paternal Grandmother        LEUKEMIA   Diabetes Paternal Grandfather    Kidney disease Paternal Grandfather    Problem                               Relation Asthma                                   no Eczema                                no Food allergy                          no Allergic rhino conjunctivitis     no  Review of Systems  Constitutional:  Positive for appetite change. Negative for chills, fever and unexpected weight change.  HENT:  Positive for congestion and rhinorrhea.   Eyes:  Negative for itching.   Respiratory:  Positive for cough and wheezing. Negative for chest tightness and shortness of breath.   Cardiovascular:  Negative for chest pain.  Gastrointestinal:  Negative for abdominal pain.  Genitourinary:  Negative for difficulty urinating.  Skin:  Positive for rash.  Neurological:  Negative for headaches.    Objective: BP 102/80 (BP Location: Left Arm, Patient Position: Sitting, Cuff Size: Normal)   Pulse 83   Temp 98.2 F (36.8 C) (Temporal)   Resp 20   Ht 5' 3.78 (1.62 m)   Wt 181 lb 8 oz (82.3 kg)   SpO2 98%   BMI 31.37 kg/m  Body mass index is 31.37 kg/m. Physical Exam Vitals and nursing note reviewed.  Constitutional:      Appearance: Normal appearance. She is well-developed.  HENT:     Head: Normocephalic and atraumatic.     Right Ear: Tympanic membrane and external ear normal.     Left Ear: Tympanic membrane and external ear normal.     Nose: Nose normal.     Mouth/Throat:     Mouth: Mucous membranes are moist.     Pharynx: Oropharynx is clear.   Eyes:     Conjunctiva/sclera: Conjunctivae normal.    Cardiovascular:     Rate and Rhythm: Normal rate and regular rhythm.     Heart sounds: Normal heart sounds. No murmur heard.    No friction rub. No gallop.  Pulmonary:     Effort: Pulmonary effort is normal.     Breath sounds: Normal breath sounds. No wheezing, rhonchi or rales.   Musculoskeletal:     Cervical back: Neck supple.   Skin:    General: Skin is warm.     Findings: No rash.  Neurological:     Mental Status: She is alert and oriented to person, place, and time.   Psychiatric:        Behavior: Behavior normal.   The plan was reviewed with the patient/family, and all questions/concerned were addressed.  It was my pleasure to see [place] today and participate in her care. Please feel free to contact me with any questions or concerns.  Sincerely,  Eudelia Hero, DO Allergy & Immunology  [facility] office: [phone] [facility] office: [phone]

## 2023-11-27 NOTE — Patient Instructions (Addendum)
 Rhinitis  Return for allergy skin testing. Will make additional recommendations based on results. Make sure you don't take any antihistamines for 3 days before the skin testing appointment. Don't put any lotion on the back and arms on the day of testing.  Plan on being here for 30-60 minutes.  Use Flonase (fluticasone) nasal spray 1-2 sprays per nostril once a day as needed for nasal congestion.  Nasal saline spray (i.e., Simply Saline) or nasal saline lavage (i.e., NeilMed) is recommended as needed and prior to medicated nasal sprays. Use over the counter antihistamines such as Zyrtec  (cetirizine ), Claritin (loratadine), Allegra (fexofenadine), or Xyzal (levocetirizine) daily as needed. May take twice a day during allergy flares. May switch antihistamines every few months. Hold 3 days before skin testing.   Foods Continue to avoid foods that are bothersome - pecans, avocado, bananas, cherries. I have prescribed epinephrine injectable device and demonstrated proper use. For mild symptoms you can take over the counter antihistamines (zyrtec  10mg  to 20mg ) and monitor symptoms closely.  If symptoms worsen or if you have severe symptoms including breathing issues, throat closure, significant swelling, whole body hives, severe diarrhea and vomiting, lightheadedness then use epinephrine and seek immediate medical care afterwards. Emergency action plan given. Plan on select food testing at next visit.   Coughing Normal breathing test today.  Monitor symptoms. May use albuterol rescue inhaler 2 puffs every 4 to 6 hours as needed for shortness of breath, chest tightness, coughing, and wheezing. May use albuterol rescue inhaler 2 puffs 5 to 15 minutes prior to strenuous physical activities. Monitor frequency of use - if you need to use it more than twice per week on a consistent basis let us  know.   Rash  Keep track of rashes and take pictures. See below for proper skin care. Use fragrance free and  dye free products. No dryer sheets or fabric softener.   Use desonide 0.05% ointment twice a day as needed for mild rash flares - okay to use on the face, neck, groin area. Do not use more than 1 week at a time.  Follow up for skin testing.    Skin care recommendations  Bath time: Always use lukewarm water. AVOID very hot or cold water. Keep bathing time to 5-10 minutes. Do NOT use bubble bath. Use a mild soap and use just enough to wash the dirty areas. Do NOT scrub skin vigorously.  After bathing, pat dry your skin with a towel. Do NOT rub or scrub the skin.  Moisturizers and prescriptions:  ALWAYS apply moisturizers immediately after bathing (within 3 minutes). This helps to lock-in moisture. Use the moisturizer several times a day over the whole body. Good summer moisturizers include: Aveeno, CeraVe, Cetaphil. Good winter moisturizers include: Aquaphor, Vaseline, Cerave, Cetaphil, Eucerin, Vanicream. When using moisturizers along with medications, the moisturizer should be applied about one hour after applying the medication to prevent diluting effect of the medication or moisturize around where you applied the medications. When not using medications, the moisturizer can be continued twice daily as maintenance.  Laundry and clothing: Avoid laundry products with added color or perfumes. Use unscented hypo-allergenic laundry products such as Tide free, Cheer free & gentle, and All free and clear.  If the skin still seems dry or sensitive, you can try double-rinsing the clothes. Avoid tight or scratchy clothing such as wool. Do not use fabric softeners or dyer sheets.

## 2023-12-03 NOTE — Progress Notes (Unsigned)
 Skin testing note  RE: Laura Norton MRN: [id number] DOB: 07/25/2005 Date of Office Visit: 12/04/2023  Referring provider: Joshua Santana CROME, NP Primary care provider: Joshua Santana CROME, NP  Chief Complaint: skin testing  History of Present Illness: I had the pleasure of seeing Laura Norton for a skin testing visit at the [facility] on 12/04/2023. She is a 18 y.o. female, who is being followed for adverse reaction, allergic rhinitis, reactive airway disease and atopic dermatitis. Her previous allergy office visit was on 11/27/2023 with Dr. Luke. Today is a skin testing visit.  She is accompanied today by her mother who provided/contributed to the history.   Discussed the use of AI scribe software for clinical note transcription with the patient, who gave verbal consent to proceed.    She uses allergy medications and nasal sprays as needed. When staying at a friend's house with a cat, she takes Zyrtec . She also uses natural allergy drops from [place] as needed.   She has oral allergy syndrome, experiencing sensations in her mouth when consuming certain foods and vegetables that cross-react with pollen allergens.  She has an EpiPen due to previous food-related allergies, although current tests for these foods returned negative results.  She experiences occasional heartburn or reflux, primarily when eating late or consuming heavy meals, which she finds easy to avoid.  No current breathing problems and reports improvement in symptoms. Her inhaler, initially not covered by insurance, is now available after resolving a system glitch.     No issues with latex.  Tolerates other tree nuts.  Assessment and Plan: Janavia is a 18 y.o. female with: Other allergic rhinitis Today's skin testing positive to grass, ragweed, weed, trees, mold, dust mites, cat.  Start environmental control measures as below. Use over the counter antihistamines such as Zyrtec   (cetirizine ), Claritin (loratadine), Allegra (fexofenadine), or Xyzal (levocetirizine) daily as needed. May take twice a day during allergy flares. May switch antihistamines every few months. Use Flonase (fluticasone) nasal spray 1-2 sprays per nostril once a day as needed for nasal congestion.  Nasal saline spray (i.e., Simply Saline) or nasal saline lavage (i.e., NeilMed) is recommended as needed and prior to medicated nasal sprays. Recommend allergy injections. 2 injections. Let us  know when ready to start.  Had a detailed discussion with patient/family that clinical history is suggestive of allergic rhinitis, and may benefit from allergy immunotherapy (AIT). Discussed in detail regarding the dosing, schedule, side effects (mild to moderate local allergic reaction and rarely systemic allergic reactions including anaphylaxis), and benefits (significant improvement in nasal symptoms, seasonal flares of asthma) of immunotherapy with the patient. There is significant time commitment involved with allergy shots, which includes weekly immunotherapy injections for first 9-12 months and then biweekly to monthly injections for 3-5 years.   Other adverse food reactions, not elsewhere classified, subsequent encounter Oral allergy syndrome, subsequent encounter Past history - reactions to pecans, avocados, bananas, and black cherries with pruritus, lip swelling, and pain.  Today's skin testing negative to select foods. Continue to avoid foods that are bothersome - pecans, avocado, bananas, cherries. For mild symptoms you can take over the counter antihistamines (zyrtec  10mg  to 20mg ) and monitor symptoms closely.  If symptoms worsen or if you have severe symptoms including breathing issues, throat closure, significant swelling, whole body hives, severe diarrhea and vomiting, lightheadedness then use epinephrine and seek immediate medical care afterwards. Emergency action plan in place. Discussed that her food  triggered oral and throat  symptoms are likely caused by oral food allergy syndrome (OFAS). This is caused by cross reactivity of pollen with fresh fruits and vegetables, and nuts. Symptoms are usually localized in the form of itching and burning in mouth and throat. Very rarely it can progress to more severe symptoms. Eating foods in cooked or processed forms usually minimizes symptoms. I recommended avoidance of eating the problem foods, especially during the peak season(s). Sometimes, OFAS can induce severe throat swelling or even a systemic reaction; with such instance, I advised them to report to a local ER. A list of common pollens and food cross-reactivities was provided to the patient.    Reactive airway disease, mild intermittent, uncomplicated Past history - episodes of coughing and wheezing, especially with exertion, during infections possibly postprandial. 2025 spirometry was normal. May use albuterol rescue inhaler 2 puffs every 4 to 6 hours as needed for shortness of breath, chest tightness, coughing, and wheezing. May use albuterol rescue inhaler 2 puffs 5 to 15 minutes prior to strenuous physical activities. Monitor frequency of use - if you need to use it more than twice per week on a consistent basis let us  know.    Other atopic dermatitis Past history - Mainly on eyelids. Keep track of rashes and take pictures. See below for proper skin care. Use fragrance free and dye free products. No dryer sheets or fabric softener.   Use desonide 0.05% ointment twice a day as needed for mild rash flares - okay to use on the face, neck, groin area. Do not use more than 1 week at a time.  Heartburn See handout for lifestyle and dietary modifications.  Return in about 4 months (around 04/04/2024).  No orders of the defined types were placed in this encounter.  Lab Orders  No laboratory test(s) ordered today    Diagnostics: Skin Testing: Environmental allergy panel and select  foods. Today's skin testing positive to grass, ragweed, weed, trees, mold, dust mites, cat.  Borderline to almonds only. Results discussed with patient/family.  Airborne Adult Perc - 12/04/23 1343     Time Antigen Placed 1344    Allergen Manufacturer Jestine    Location Back    Number of Test 55    1. Control-Buffer 50% Glycerol Negative    2. Control-Histamine 3+    3. Bahia Negative    4. [place] 3+    5. Johnson 2+    6. [place]  Blue 4+    7. Meadow Fescue 4+    8. Perennial Rye 4+    9. Timothy 4+    10. Ragweed Mix 2+    11. Cocklebur Negative    12. Plantain,  English 4+    13. Baccharis Negative    14. Dog Fennel 3+    15. Russian Thistle 3+    16. Lamb's Quarters 2+    17. Sheep Sorrell 3+    18. Rough Pigweed 2+    19. Marsh Elder, Rough Negative    20. Mugwort, Common Negative    21. Box, Elder 2+    22. Cedar, red Negative    23. Sweet Gum 3+    24. Pecan Pollen 4+    25. Pine Mix 2+    26. Walnut, Black Pollen 4+    27. Red Mulberry 4+    28. Ash Mix 4+    29. Birch Mix 3+    30. Beech American 3+    31. Cottonwood, Eastern 4+    32. Hickory, White 4+  33. Maple Mix Negative    34. Oak, [place] Mix 3+    35. Sycamore Eastern Negative    36. Alternaria Alternata 2+    37. Cladosporium Herbarum Negative    38. Aspergillus Mix Negative    39. Penicillium Mix Negative    40. Bipolaris Sorokiniana (Helminthosporium) Negative    41. Drechslera Spicifera (Curvularia) 2+    42. Mucor Plumbeus Negative    43. Fusarium Moniliforme Negative    44. Aureobasidium Pullulans (pullulara) Negative    45. Rhizopus Oryzae Negative    46. Botrytis Cinera Negative    47. Epicoccum Nigrum Negative    48. Phoma Betae Negative    49. Dust Mite Mix 2+    50. Cat Hair 10,000 BAU/ml 2+    51.  Dog Epithelia Negative    52. Mixed Feathers Negative    53. Horse Epithelia Negative    54. Cockroach, German Negative    55. Tobacco Leaf Negative          Intradermal -  12/04/23 1400     Time Antigen Placed 1411    Allergen Manufacturer Jestine    Location Arm    Number of Test 4    Control Negative    Bahia 3+    Mold 2 Negative    Mold 4 3+    Dog Negative    Cockroach Negative          Food Adult Perc - 12/04/23 1300     Time Antigen Placed 1344    Allergen Manufacturer Jestine    Location Back     Control-buffer 50% Glycerol Negative    Control-Histamine 3+    12. Almond --   +/-   14. Pecan Food Negative    48. Avocado Negative    57. Banana Negative    62. Cherry Negative          Previous notes and tests were reviewed. The plan was reviewed with the patient/family, and all questions/concerned were addressed.  It was my pleasure to see Aquanetta today and participate in her care. Please feel free to contact me with any questions or concerns.  Sincerely,  Orlan Cramp, DO Allergy & Immunology  [facility]  [place] office: [phone] [facility] office: [phone]

## 2023-12-04 ENCOUNTER — Ambulatory Visit (INDEPENDENT_AMBULATORY_CARE_PROVIDER_SITE_OTHER): Admitting: Allergy

## 2023-12-04 ENCOUNTER — Encounter: Payer: Self-pay | Admitting: Allergy

## 2023-12-04 DIAGNOSIS — R12 Heartburn: Secondary | ICD-10-CM

## 2023-12-04 DIAGNOSIS — T781XXD Other adverse food reactions, not elsewhere classified, subsequent encounter: Secondary | ICD-10-CM | POA: Diagnosis not present

## 2023-12-04 DIAGNOSIS — J3089 Other allergic rhinitis: Secondary | ICD-10-CM | POA: Diagnosis not present

## 2023-12-04 DIAGNOSIS — J4520 Mild intermittent asthma, uncomplicated: Secondary | ICD-10-CM

## 2023-12-04 DIAGNOSIS — L2089 Other atopic dermatitis: Secondary | ICD-10-CM

## 2023-12-04 NOTE — Patient Instructions (Addendum)
 Today's skin testing positive to grass, ragweed, weed, trees, mold, dust mites, cat.  Borderline to almonds only.  Results given.  Environmental allergies Start environmental control measures as below. Use over the counter antihistamines such as Zyrtec  (cetirizine ), Claritin (loratadine), Allegra (fexofenadine), or Xyzal (levocetirizine) daily as needed. May take twice a day during allergy flares. May switch antihistamines every few months. Use Flonase (fluticasone) nasal spray 1-2 sprays per nostril once a day as needed for nasal congestion.  Nasal saline spray (i.e., Simply Saline) or nasal saline lavage (i.e., NeilMed) is recommended as needed and prior to medicated nasal sprays. Recommend allergy injections. 2 injections. Let us  know when ready to start.  Had a detailed discussion with patient/family that clinical history is suggestive of allergic rhinitis, and may benefit from allergy immunotherapy (AIT). Discussed in detail regarding the dosing, schedule, side effects (mild to moderate local allergic reaction and rarely systemic allergic reactions including anaphylaxis), and benefits (significant improvement in nasal symptoms, seasonal flares of asthma) of immunotherapy with the patient. There is significant time commitment involved with allergy shots, which includes weekly immunotherapy injections for first 9-12 months and then biweekly to monthly injections for 3-5 years.   Food Continue to avoid foods that are bothersome - pecans, avocado, bananas, cherries. For mild symptoms you can take over the counter antihistamines (zyrtec  10mg  to 20mg ) and monitor symptoms closely.  If symptoms worsen or if you have severe symptoms including breathing issues, throat closure, significant swelling, whole body hives, severe diarrhea and vomiting, lightheadedness then use epinephrine and seek immediate medical care afterwards. Emergency action plan in place. Discussed that her food triggered oral and  throat symptoms are likely caused by oral food allergy syndrome (OFAS). This is caused by cross reactivity of pollen with fresh fruits and vegetables, and nuts. Symptoms are usually localized in the form of itching and burning in mouth and throat. Very rarely it can progress to more severe symptoms. Eating foods in cooked or processed forms usually minimizes symptoms. I recommended avoidance of eating the problem foods, especially during the peak season(s). Sometimes, OFAS can induce severe throat swelling or even a systemic reaction; with such instance, I advised them to report to a local ER. A list of common pollens and food cross-reactivities was provided to the patient.   Coughing May use albuterol rescue inhaler 2 puffs every 4 to 6 hours as needed for shortness of breath, chest tightness, coughing, and wheezing. May use albuterol rescue inhaler 2 puffs 5 to 15 minutes prior to strenuous physical activities. Monitor frequency of use - if you need to use it more than twice per week on a consistent basis let us  know.   Rash  Keep track of rashes and take pictures. Continue proper skin care. Use fragrance free and dye free products. No dryer sheets or fabric softener.   Use desonide 0.05% ointment twice a day as needed for mild rash flares - okay to use on the face, neck, groin area. Do not use more than 1 week at a time.  Heartburn See handout for lifestyle and dietary modifications.  Return in about 4 months (around 04/04/2024). Or sooner if needed.   Reducing Pollen Exposure Pollen seasons: trees (spring), grass (summer) and ragweed/weeds (fall). Keep windows closed in your home and car to lower pollen exposure.  Install air conditioning in the bedroom and throughout the house if possible.  Avoid going out in dry windy days - especially early morning. Pollen counts are highest between 5 - 10  AM and on dry, hot and windy days.  Save outside activities for late afternoon or after a heavy  rain, when pollen levels are lower.  Avoid mowing of grass if you have grass pollen allergy. Be aware that pollen can also be transported indoors on people and pets.  Dry your clothes in an automatic dryer rather than hanging them outside where they might collect pollen.  Rinse hair and eyes before bedtime. Mold Control Mold and fungi can grow on a variety of surfaces provided certain temperature and moisture conditions exist.  Outdoor molds grow on plants, decaying vegetation and soil. The major outdoor mold, Alternaria and Cladosporium, are found in very high numbers during hot and dry conditions. Generally, a late summer - fall peak is seen for common outdoor fungal spores. Rain will temporarily lower outdoor mold spore count, but counts rise rapidly when the rainy period ends. The most important indoor molds are Aspergillus and Penicillium. Dark, humid and poorly ventilated basements are ideal sites for mold growth. The next most common sites of mold growth are the bathroom and the kitchen. Outdoor (Seasonal) Mold Control Use air conditioning and keep windows closed. Avoid exposure to decaying vegetation. Avoid leaf raking. Avoid grain handling. Consider wearing a face mask if working in moldy areas.  Indoor (Perennial) Mold Control  Maintain humidity below 50%. Get rid of mold growth on hard surfaces with water, detergent and, if necessary, 5% bleach (do not mix with other cleaners). Then dry the area completely. If mold covers an area more than 10 square feet, consider hiring an indoor environmental professional. For clothing, washing with soap and water is best. If moldy items cannot be cleaned and dried, throw them away. Remove sources e.g. contaminated carpets. Repair and seal leaking roofs or pipes. Using dehumidifiers in damp basements may be helpful, but empty the water and clean units regularly to prevent mildew from forming. All rooms, especially basements, bathrooms and kitchens,  require ventilation and cleaning to deter mold and mildew growth. Avoid carpeting on concrete or damp floors, and storing items in damp areas. Control of House Dust Mite Allergen Dust mite allergens are a common trigger of allergy and asthma symptoms. While they can be found throughout the house, these microscopic creatures thrive in warm, humid environments such as bedding, upholstered furniture and carpeting. Because so much time is spent in the bedroom, it is essential to reduce mite levels there.  Encase pillows, mattresses, and box springs in special allergen-proof fabric covers or airtight, zippered plastic covers.  Bedding should be washed weekly in hot water (130 F) and dried in a hot dryer. Allergen-proof covers are available for comforters and pillows that can't be regularly washed.  Wash the allergy-proof covers every few months. Minimize clutter in the bedroom. Keep pets out of the bedroom.  Keep humidity less than 50% by using a dehumidifier or air conditioning. You can buy a humidity measuring device called a hygrometer to monitor this.  If possible, replace carpets with hardwood, linoleum, or washable area rugs. If that's not possible, vacuum frequently with a vacuum that has a HEPA filter. Remove all upholstered furniture and non-washable window drapes from the bedroom. Remove all non-washable stuffed toys from the bedroom.  Wash stuffed toys weekly. Pet Allergen Avoidance: Contrary to popular opinion, there are no "hypoallergenic" breeds of dogs or cats. That is because people are not allergic to an animal's hair, but to an allergen found in the animal's saliva, dander (dead skin flakes) or urine. Pet  allergy symptoms typically occur within minutes. For some people, symptoms can build up and become most severe 8 to 12 hours after contact with the animal. People with severe allergies can experience reactions in public places if dander has been transported on the pet owners'  clothing. Keeping an animal outdoors is only a partial solution, since homes with pets in the yard still have higher concentrations of animal allergens. Before getting a pet, ask your allergist to determine if you are allergic to animals. If your pet is already considered part of your family, try to minimize contact and keep the pet out of the bedroom and other rooms where you spend a great deal of time. As with dust mites, vacuum carpets often or replace carpet with a hardwood floor, tile or linoleum. High-efficiency particulate air (HEPA) cleaners can reduce allergen levels over time. While dander and saliva are the source of cat and dog allergens, urine is the source of allergens from rabbits, hamsters, mice and israel pigs; so ask a non-allergic family member to clean the animal's cage. If you have a pet allergy, talk to your allergist about the potential for allergy immunotherapy (allergy shots). This strategy can often provide long-term relief. Cockroach Allergen Avoidance Cockroaches are often found in the homes of densely populated urban areas, schools or commercial buildings, but these creatures can lurk almost anywhere. This does not mean that you have a dirty house or living area. Block all areas where roaches can enter the home. This includes crevices, wall cracks and windows.  Cockroaches need water to survive, so fix and seal all leaky faucets and pipes. Have an exterminator go through the house when your family and pets are gone to eliminate any remaining roaches. Keep food in lidded containers and put pet food dishes away after your pets are done eating. Vacuum and sweep the floor after meals, and take out garbage and recyclables. Use lidded garbage containers in the kitchen. Wash dishes immediately after use and clean under stoves, refrigerators or toasters where crumbs can accumulate. Wipe off the stove and other kitchen surfaces and cupboards regularly.

## 2023-12-09 ENCOUNTER — Encounter (INDEPENDENT_AMBULATORY_CARE_PROVIDER_SITE_OTHER): Payer: Self-pay | Admitting: Pediatrics

## 2023-12-09 ENCOUNTER — Telehealth (INDEPENDENT_AMBULATORY_CARE_PROVIDER_SITE_OTHER): Payer: Self-pay | Admitting: Pediatrics

## 2023-12-09 NOTE — Telephone Encounter (Signed)
See mychart encounter for further information.

## 2023-12-09 NOTE — Telephone Encounter (Signed)
  Name of who is calling: Laura Norton   Caller's Relationship to Patient: self  Best contact number: [phone]  Provider they see: Margarete  Reason for call: pt stated that [facility] only had 1 apply available which she was able to get but the pharmacies are no longer using that brand. She stated that she has not been to her GYN in a few years & the doctor has since then retired. She needs the new Rx for Sunday.     PRESCRIPTION REFILL ONLY  Name of prescription:  Pharmacy:

## 2024-01-28 ENCOUNTER — Encounter: Payer: Self-pay | Admitting: "Endocrinology

## 2024-01-28 ENCOUNTER — Ambulatory Visit: Admitting: "Endocrinology

## 2024-01-28 VITALS — BP 120/80 | HR 74 | Ht 63.0 in | Wt 186.0 lb

## 2024-01-28 DIAGNOSIS — E282 Polycystic ovarian syndrome: Secondary | ICD-10-CM

## 2024-01-28 DIAGNOSIS — E782 Mixed hyperlipidemia: Secondary | ICD-10-CM | POA: Diagnosis not present

## 2024-01-28 DIAGNOSIS — E039 Hypothyroidism, unspecified: Secondary | ICD-10-CM

## 2024-01-28 NOTE — Progress Notes (Signed)
 Outpatient Endocrinology Note Laura Birmingham, MD    Laura Norton December 14, 2005 [id number]  Referring Provider: Margarete Golds, MD Primary Care Provider: Joshua Santana CROME, NP Reason for consultation: Subjective   Assessment & Plan  Diagnoses and all orders for this visit:  Acquired hypothyroidism -     TSH + free T4 -     Hemoglobin A1c  PCOS (polycystic ovarian syndrome)  Mixed hypercholesterolemia and hypertriglyceridemia -     Hemoglobin A1c -     Insulin , random -     TSH + free T4 -     Ambulatory referral to diabetic education  Diagnosed of PCOS around 2022 based on elevated testosterone , excess chin and upper lip hair and irregular menses since started menarche at 9  Metformin  was stopped after the testosterone  normalized Patient is not currently interested in metformin  Discussed diet at length, ordered nutrition consult Continue birth control under PCP/GYN  Hyperlipidemia, mixed Recommend weight loss Discussed lifestyle changes Patient not interested in weight loss medication at this point Ordered baseline insulin  and A1c  Diagnosed of hypothyroidism at 18 She is taking levothyroxine  50mcg every day, appropriately  TSH WNL with slightly high FT4 on last check.  Ordered repeat lab Recommend to take levothyroxine  first conversation with in the morning on empty stomach and wait at least 30 minutes to 1 hour before eating or drinking anything or taking any other medications. Space out levothyroxine  by 4 hours from any acid reflux medication, fibrate, iron, calcium, multivitamin, birth control pills and nutritional supplements.   Return in about 6 months (around 07/30/2024) for visit + labs before next visit, labs today.   I have reviewed current medications, nurse's notes, allergies, vital signs, past medical and surgical history, family medical history, and social history for this encounter. Counseled patient on symptoms, examination findings, lab  findings, imaging results, treatment decisions and monitoring and prognosis. The patient understood the recommendations and agrees with the treatment plan. All questions regarding treatment plan were fully answered.  Laura Birmingham, MD  01/28/24   History of Present Illness HPI  Laura Norton is a 18 y.o. year old female who presents for evaluation of hypothyroidism and PCOS.  Patient is here with her mother, patient is translating conversation for her mother  Diagnosed of hypothyroidism at 18 She is taking levothyroxine  50mcg every day, appropriately   She was on metformin  by her pediatric endocrinologist but doctor stopped it, no know S/E except irregular bleeding then which stopped after her birth control was changed  She is on birth control  Diagnosed of PCOS around 2015, per notes She also has PCOS treated with metformin  that was discontinued 11/24/21 as free testosterone  had normalized. Pelvic ultrasound in August 2022 was normal.  Component     Latest Ref Rng 01/24/2021  Testosterone , Total, LC-MS-MS     <=40 ng/dL 51 (H)   Free Testosterone      0.5 - 3.9 pg/mL 10.9 (H)   Sex Horm Binding Glob, Serum     12 - 150 nmol/L 15     THYROID  ULTRASOUND   TECHNIQUE: Ultrasound examination of the thyroid  gland and adjacent soft tissues was performed.   COMPARISON:  None.   FINDINGS: Parenchymal Echotexture: Mildly heterogenous   Isthmus: 0.5 cm   Right lobe: 5.8 cm x 1.9 cm x 1.9 cm   Left lobe: 4.9 cm x 1.8 cm x 1.7 cm   _________________________________________________________   Estimated total number of nodules >/= 1 cm: 0  Number of spongiform nodules >/=  2 cm not described below (TR1): 0   Number of mixed cystic and solid nodules >/= 1.5 cm not described below (TR2): 0   _________________________________________________________   No nodules are present which meet criteria for surveillance or biopsy.   Small lymph node in the right neck with  typical architecture maintained.   IMPRESSION: Mildly heterogeneous thyroid  tissue may indicate medical thyroid  disease.   Typical appearing lymph node of the right neck.   Physical Exam  BP 120/80   Pulse 74   Ht 5' 3 (1.6 m)   Wt 186 lb (84.4 kg)   SpO2 98%   BMI 32.95 kg/m    Constitutional: well developed, well nourished Head: normocephalic, atraumatic Eyes: sclera anicteric, no redness Neck: supple Lungs: normal respiratory effort Neurology: alert and oriented Skin: dry, no appreciable rashes Musculoskeletal: no appreciable defects Psychiatric: normal mood and affect   Current Medications Patient's Medications  New Prescriptions   No medications on file  Previous Medications   ALBUTEROL  (VENTOLIN  HFA) 108 (90 BASE) MCG/ACT INHALER    Inhale 2 puffs into the lungs every 4 (four) hours as needed for wheezing or shortness of breath (coughing fits).   BUDESONIDE  (RHINOCORT  AQUA) 32 MCG/ACT NASAL SPRAY    Place 1 spray into both nostrils daily.   CETIRIZINE  (ZYRTEC  ALLERGY ) 10 MG TABLET    Take 1 tablet (10 mg total) by mouth at bedtime.   DESOGESTREL -ETHINYL ESTRADIOL  (APRI ) 0.15-30 MG-MCG TABLET    Take 1 tablet by mouth daily.   DESONIDE  (DESOWEN ) 0.05 % OINTMENT    Apply 1 Application topically 2 (two) times daily as needed (mild rash flare). Okay to use on the face, neck, groin area. Do not use more than 1 week at a time.   EPINEPHRINE  0.3 MG/0.3 ML IJ SOAJ INJECTION    Inject 0.3 mg into the muscle as needed for anaphylaxis.   LEVOTHYROXINE  (SYNTHROID ) 50 MCG TABLET    Take 1 tablet (50 mcg total) by mouth daily.   MAGNESIUM CITRATE SOLN    Take 1 Bottle by mouth once.   VITAMIN D PO    Take by mouth.  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 Laura Norton established care with this practice 01/23/21.      PCOS (polycystic ovarian syndrome) 02/09/2021   She also has PCOS treated with metformin  that was discontinued 11/24/21 as free testosterone  had normalized. Pelvic ultrasound in August 2022 was normal. She has tried multiple OCPs prescribed by me and OBGYN as we are trying to find an OCP that does not worsen her depression, and Apri  is working. She established care 01/23/2021.       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  Social History Social History   Socioeconomic History   Marital status: Single    Spouse name: Not on file   Number of children: Not on file   Years of education: Not on file   Highest education level: Not on file  Occupational History   Not on file  Tobacco Use   Smoking status: Never    Passive exposure: Never   Smokeless tobacco: Never  Vaping Use   Vaping status: Never Used  Substance and Sexual Activity   Alcohol use: No   Drug use: No   Sexual activity: Not on file  Other Topics Concern   Not on file  Social History Narrative   She lives with parents, 2 dogs   She graduated  from [employer] November 17, 2021. Working right now.    [employer] Degree  in arts   She enjoy singing, dancing and travel (favorite place is [place])    Social Drivers of Corporate investment banker Strain: Not on file  Food Insecurity: Not on file  Transportation Needs: Not on file  Physical Activity: Not on file  Stress: Not on file  Social Connections: Not on file  Intimate Partner Violence: Not on file    No results found for: CHOL No results found for: HDL No results found for: LDLCALC No results found for: TRIG No results found for: [facility] Lab Results  Component Value Date   CREATININE 0.4 05/19/2007   No results found for: GFR    Component Value Date/Time   NA 134 (L) 05/19/2007 1411   K 3.7 05/19/2007 1411   CL 98 05/19/2007 1411   CO2 26 05/19/2007 1411   GLUCOSE 84 05/19/2007 1411   BUN 12 05/19/2007 1411   CREATININE 0.4 05/19/2007 1411   CALCIUM 9.8 05/19/2007 1411   PROT 7.4 01/24/2021 1115   ALBUMIN 3.8 05/19/2007 1411   AST 16 01/24/2021 1115   ALT 18 01/24/2021 1115   ALKPHOS 256 (H) 05/19/2007 1411   BILITOT 0.4 01/24/2021 1115   GFRNONAA 318 05/19/2007 1411   GFRAA 385 05/19/2007 1411      Latest Ref Rng & Units 05/19/2007    2:11 PM  BMP  Glucose 70 - 99 mg/dL 84   BUN 6 - 23 mg/dL 12   Creatinine 0.4 - 1.2 mg/dL 0.4   Sodium 864 - 854 meq/L 134   Potassium 3.5 - 5.1 meq/L 3.7   Chloride 96 - 112 meq/L 98   CO2 19 - 32 meq/L 26   Calcium 8.4 - 10.5 mg/dL 9.8        Component Value Date/Time   WBC 11.7 01/02/2016 0800   RBC 4.98 01/02/2016 0800   HGB 14.1 01/02/2016 0800   HCT 42.5 01/02/2016 0800   PLT 235 01/02/2016 0800   MCV 85.3 01/02/2016 0800   MCH 28.3 01/02/2016 0800   MCHC 33.2 01/02/2016 0800   RDW 13.1 01/02/2016 0800   MONOABS 1.5 (H) 05/28/2007 1400   EOSABS 0.4 05/28/2007 1400   BASOSABS 0.0 05/28/2007 1400   Lab Results  Component Value Date   TSH 1.42 06/27/2023   TSH 4.93 (H) 01/28/2023   TSH  5.65 (H) 07/13/2022   FREET4 1.5 (H) 06/27/2023   FREET4 1.1 01/28/2023  FREET4 1.2 07/13/2022         Parts of this note may have been dictated using voice recognition software. There may be variances in spelling and vocabulary which are unintentional. Not all errors are proofread. Please notify the dino if any discrepancies are noted or if the meaning of any statement is not clear.

## 2024-01-29 LAB — HEMOGLOBIN A1C
Hgb A1c MFr Bld: 5.5 % (ref <5.7)
Mean Plasma Glucose: 111 mg/dL
eAG (mmol/L): 6.2 mmol/L

## 2024-01-29 LAB — INSULIN, RANDOM: Insulin: 146.2 u[IU]/mL — ABNORMAL HIGH

## 2024-01-29 LAB — TSH+FREE T4: TSH W/REFLEX TO FT4: 3.38 m[IU]/L

## 2024-02-05 ENCOUNTER — Other Ambulatory Visit (INDEPENDENT_AMBULATORY_CARE_PROVIDER_SITE_OTHER): Payer: Self-pay | Admitting: Pediatrics

## 2024-02-05 DIAGNOSIS — E282 Polycystic ovarian syndrome: Secondary | ICD-10-CM

## 2024-02-10 ENCOUNTER — Other Ambulatory Visit (INDEPENDENT_AMBULATORY_CARE_PROVIDER_SITE_OTHER): Payer: Self-pay | Admitting: Pediatrics

## 2024-02-10 DIAGNOSIS — E282 Polycystic ovarian syndrome: Secondary | ICD-10-CM

## 2024-02-11 ENCOUNTER — Other Ambulatory Visit (INDEPENDENT_AMBULATORY_CARE_PROVIDER_SITE_OTHER): Payer: Self-pay

## 2024-02-11 DIAGNOSIS — E282 Polycystic ovarian syndrome: Secondary | ICD-10-CM

## 2024-03-04 ENCOUNTER — Encounter: Payer: Self-pay | Admitting: Obstetrics and Gynecology

## 2024-03-04 ENCOUNTER — Ambulatory Visit (INDEPENDENT_AMBULATORY_CARE_PROVIDER_SITE_OTHER): Admitting: Obstetrics and Gynecology

## 2024-03-04 VITALS — BP 110/60 | HR 105 | Ht 63.39 in | Wt 185.8 lb

## 2024-03-04 DIAGNOSIS — E282 Polycystic ovarian syndrome: Secondary | ICD-10-CM | POA: Diagnosis not present

## 2024-03-04 MED ORDER — ENSKYCE 0.15-30 MG-MCG PO TABS: 1.0000 | ORAL_TABLET | Freq: Every day | ORAL | 6 refills | Status: AC

## 2024-03-04 NOTE — Progress Notes (Signed)
   Acute Office Visit  Subjective:    Patient ID: Laura Norton, female    DOB: 10/31/2005, 18 y.o.   MRN: [id number]   HPI 19 y.o. presents today for Gynecologic Exam (Discuss PCOS and talk about birth control. Cherre seen by Lavoie in 2023/Never had a pap/) .denies having the gardesil vaccination Likes Enskyce  and has been on it for 3 years.  The pharmacy has tried to give her Aprii and she cannot take it. Patient also with hypothryoidism. Before ocp's she could go 6-7 months with out a period. Has acne and hirsutism.   Patient's last menstrual period was 02/06/2024 (exact date). Period Cycle (Days): 28 Period Duration (Days): 5-7 Period Pattern: Regular Menstrual Flow: Moderate Menstrual Control: Maxi pad, Tampon Dysmenorrhea: (!) Moderate Dysmenorrhea Symptoms: Headache, Cramping, Nausea, Diarrhea  Review of Systems     Objective:    OBGyn Exam  BP 110/60   Pulse (!) 105   Ht 5' 3.39 (1.61 m)   Wt 185 lb 12.8 oz (84.3 kg)   LMP 02/06/2024 (Exact Date)   SpO2 98%   BMI 32.51 kg/m  Wt Readings from Last 3 Encounters:  03/04/24 185 lb 12.8 oz (84.3 kg)  01/28/24 186 lb (84.4 kg)  11/27/23 181 lb 8 oz (82.3 kg)          Assessment & Plan:  PCO Hypothyroidism Discuss current birth control  Encouraged patient to not take a generic brand of enskyce , as she has done well on the original brand Counseled on the importance of monitoring simple sugars, limiting carbohydrate intake to 45 grams a day, avoiding processed foods and getting more protein and fiber in your diet.  The Mediterranean diet provides a great balance. Look for PCO specific cook books to buy.  Try to get 20 min of exercise in 5 days a week. Inositol is a natural supplement that can be taken to help improve cycle regularity and hormone imbalance in PCO.  This can be found at supplement stores, amazon or [employer]. You will have a higher risk for endometrial cancer and diabetes in the future.   Counseled on the mirena IUD to help control the bleeding and protect the lining from endometrial cancer in the future.  The condition is associated with  periods of anovulation, however, discussed having the diagnosis does not mean you will not need birth control in the future. When not trying to conceive, birth control is important to help stabilize the endometrial lining and prevent endometrial cancer. Improved diet and exercise lowers the insulin  resistance and usually improve ovulation rates as well.   Brochures given on PCO RTC in may for annual exam and pap smear screening Counseled on the importance of gardesil vaccination and a brochure was given.  She will consider this and schedule if she decides to begin the vaccination. 30 minutes spent on reviewing records, imaging,  and one on one patient time and counseling patient and documentation Dr. Glennon Almarie MARLA Glennon

## 2024-03-04 NOTE — Telephone Encounter (Signed)
 I spoke to patient. She is aware she can call & transfer her rx. She is also aware that Dr Glennon wrote that she can not take generic. She will call them & will let us  know if she has any problem.

## 2024-03-24 NOTE — Progress Notes (Deleted)
 Follow Up Note  RE: Laura Norton MRN: [id number] DOB: [birth year] Date of Office Visit: 03/25/2024  Referring provider: Joshua Santana CROME, NP Primary care provider: Joshua Santana CROME, NP  Chief Complaint: No chief complaint on file.  History of Present Illness: I had the pleasure of seeing Laura Norton for a follow up visit at the [facility] on 03/25/2024. She is a 18 y.o. female, who is being followed for allergic rhinitis, adverse food reaction, reactive airway disease, atopic dermatitis and heartburn. Her previous allergy  office visit was on 12/04/2023 with Dr. Luke. Today is a regular follow up visit.  Discussed the use of AI scribe software for clinical note transcription with the patient, who gave verbal consent to proceed.  History of Present Illness            ***  Assessment and Plan: [name] is a 18 y.o. female with: Seasonal and perennial allergic rhinitis Past history - 2025 skin testing positive to grass, ragweed, weed, trees, mold, dust mites, cat.  Interim history -  Use over the counter antihistamines such as Zyrtec  (cetirizine ), Claritin (loratadine), Allegra (fexofenadine), or Xyzal (levocetirizine) daily as needed. May take twice a day during allergy  flares. May switch antihistamines every few months. Use Flonase (fluticasone) nasal spray 1-2 sprays per nostril once a day as needed for nasal congestion.  Nasal saline spray (i.e., Simply Saline) or nasal saline lavage (i.e., NeilMed) is recommended as needed and prior to medicated nasal sprays. Recommend allergy  injections. 2 injections. Let us  know when ready to start.  Had a detailed discussion with patient/family that clinical history is suggestive of allergic rhinitis, and may benefit from allergy  immunotherapy (AIT). Discussed in detail regarding the dosing, schedule, side effects (mild to moderate local allergic reaction and rarely systemic allergic reactions including  anaphylaxis), and benefits (significant improvement in nasal symptoms, seasonal flares of asthma) of immunotherapy with the patient. There is significant time commitment involved with allergy  shots, which includes weekly immunotherapy injections for first 9-12 months and then biweekly to monthly injections for 3-5 years.    Other adverse food reactions, not elsewhere classified, subsequent encounter Oral allergy  syndrome, subsequent encounter Past history - reactions to pecans, avocados, bananas, and black cherries with pruritus, lip swelling, and pain.  Today's skin testing negative to select foods. Continue to avoid foods that are bothersome - pecans, avocado, bananas, cherries. For mild symptoms you can take over the counter antihistamines (zyrtec  10mg  to 20mg ) and monitor symptoms closely.  If symptoms worsen or if you have severe symptoms including breathing issues, throat closure, significant swelling, whole body hives, severe diarrhea and vomiting, lightheadedness then use epinephrine  and seek immediate medical care afterwards. Emergency action plan in place. Discussed that her food triggered oral and throat symptoms are likely caused by oral food allergy  syndrome (OFAS). This is caused by cross reactivity of pollen with fresh fruits and vegetables, and nuts. Symptoms are usually localized in the form of itching and burning in mouth and throat. Very rarely it can progress to more severe symptoms. Eating foods in cooked or processed forms usually minimizes symptoms. I recommended avoidance of eating the problem foods, especially during the peak season(s). Sometimes, OFAS can induce severe throat swelling or even a systemic reaction; with such instance, I advised them to report to a local ER. A list of common pollens and food cross-reactivities was provided to the patient.    Reactive airway disease, mild intermittent, uncomplicated Past history - episodes of  coughing and wheezing, especially with  exertion, during infections possibly postprandial. 2025 spirometry was normal. May use albuterol  rescue inhaler 2 puffs every 4 to 6 hours as needed for shortness of breath, chest tightness, coughing, and wheezing. May use albuterol  rescue inhaler 2 puffs 5 to 15 minutes prior to strenuous physical activities. Monitor frequency of use - if you need to use it more than twice per week on a consistent basis let us  know.    Other atopic dermatitis Past history - Mainly on eyelids. Keep track of rashes and take pictures. See below for proper skin care. Use fragrance free and dye free products. No dryer sheets or fabric softener.   Use desonide  0.05% ointment twice a day as needed for mild rash flares - okay to use on the face, neck, groin area. Do not use more than 1 week at a time.   Heartburn See handout for lifestyle and dietary modifications. Assessment and Plan              No follow-ups on file.  No orders of the defined types were placed in this encounter.  Lab Orders  No laboratory test(s) ordered today    Diagnostics: Spirometry:  Tracings reviewed. Her effort: {Blank single:19197::Good reproducible efforts.,It was hard to get consistent efforts and there is a question as to whether this reflects a maximal maneuver.,Poor effort, data can not be interpreted.} FVC: ***L FEV1: ***L, ***% predicted FEV1/FVC ratio: ***% Interpretation: {Blank single:19197::Spirometry consistent with mild obstructive disease,Spirometry consistent with moderate obstructive disease,Spirometry consistent with severe obstructive disease,Spirometry consistent with possible restrictive disease,Spirometry consistent with mixed obstructive and restrictive disease,Spirometry uninterpretable due to technique,Spirometry consistent with normal pattern,No overt abnormalities noted given today's efforts}.  Please see scanned spirometry results for details.  Skin Testing: {Blank  single:19197::Select foods,Environmental allergy  panel,Environmental allergy  panel and select foods,Food allergy  panel,None,Deferred due to recent antihistamines use}. *** Results discussed with patient/family.   Medication List:  Current Outpatient Medications  Medication Sig Dispense Refill  . albuterol  (VENTOLIN  HFA) 108 (90 Base) MCG/ACT inhaler Inhale 2 puffs into the lungs every 4 (four) hours as needed for wheezing or shortness of breath (coughing fits). 18 g 1  . budesonide  (RHINOCORT  AQUA) 32 MCG/ACT nasal spray Place 1 spray into both nostrils daily. 8.43 mL 0  . cetirizine  (ZYRTEC  ALLERGY ) 10 MG tablet Take 1 tablet (10 mg total) by mouth at bedtime. 90 tablet 1  . desogestrel -ethinyl estradiol  (ENSKYCE ) 0.15-30 MG-MCG tablet Take 1 tablet by mouth daily. No generic please 84 tablet 6  . desonide  (DESOWEN ) 0.05 % ointment Apply 1 Application topically 2 (two) times daily as needed (mild rash flare). Okay to use on the face, neck, groin area. Do not use more than 1 week at a time. 60 g 2  . EPINEPHrine  0.3 mg/0.3 mL IJ SOAJ injection Inject 0.3 mg into the muscle as needed for anaphylaxis. 2 each 1  . levothyroxine  (SYNTHROID ) 50 MCG tablet Take 1 tablet (50 mcg total) by mouth daily. 30 tablet 11  . magnesium citrate SOLN Take 1 Bottle by mouth once.    SABRA VITAMIN D PO Take by mouth.     No current facility-administered medications for this visit.   Allergies: Allergies  Allergen Reactions  . Avocado Itching    Itching and burning  . Banana Itching  . Pecan Extract Itching  . Pecan Nut (Diagnostic) Itching   I reviewed her past medical history, social history, family history, and environmental history and no significant changes have  been reported from her previous visit.  Review of Systems  Constitutional:  Positive for appetite change. Negative for chills, fever and unexpected weight change.  HENT:  Positive for congestion and rhinorrhea.   Eyes:  Negative for  itching.  Respiratory:  Positive for cough and wheezing. Negative for chest tightness and shortness of breath.   Cardiovascular:  Negative for chest pain.  Gastrointestinal:  Negative for abdominal pain.  Genitourinary:  Negative for difficulty urinating.  Skin:  Positive for rash.  Allergic/Immunologic: Positive for environmental allergies.  Neurological:  Negative for headaches.    Objective: LMP 02/06/2024 (Exact Date)  There is no height or weight on file to calculate BMI. Physical Exam Vitals and nursing note reviewed.  Constitutional:      Appearance: Normal appearance. She is well-developed.  HENT:     Head: Normocephalic and atraumatic.     Right Ear: Tympanic membrane and external ear normal.     Left Ear: Tympanic membrane and external ear normal.     Nose: Nose normal.     Mouth/Throat:     Mouth: Mucous membranes are moist.     Pharynx: Oropharynx is clear.  Eyes:     Conjunctiva/sclera: Conjunctivae normal.  Cardiovascular:     Rate and Rhythm: Normal rate and regular rhythm.     Heart sounds: Normal heart sounds. No murmur heard.    No friction rub. No gallop.  Pulmonary:     Effort: Pulmonary effort is normal.     Breath sounds: Normal breath sounds. No wheezing, rhonchi or rales.  Musculoskeletal:     Cervical back: Neck supple.  Skin:    General: Skin is warm.     Findings: No rash.  Neurological:     Mental Status: She is alert and oriented to person, place, and time.  Psychiatric:        Behavior: Behavior normal.   Previous notes and tests were reviewed. The plan was reviewed with the patient/family, and all questions/concerned were addressed.  It was my pleasure to see Myrissa today and participate in her care. Please feel free to contact me with any questions or concerns.  Sincerely,  Orlan Cramp, DO Allergy  & Immunology  [facility]   [place] office: [phone] [facility] office: [phone]

## 2024-03-25 ENCOUNTER — Ambulatory Visit: Admitting: Allergy

## 2024-04-02 ENCOUNTER — Ambulatory Visit: Admitting: Obstetrics and Gynecology

## 2024-05-13 ENCOUNTER — Ambulatory Visit: Admitting: Dermatology

## 2024-05-30 ENCOUNTER — Other Ambulatory Visit (INDEPENDENT_AMBULATORY_CARE_PROVIDER_SITE_OTHER): Payer: Self-pay | Admitting: Pediatrics

## 2024-05-30 DIAGNOSIS — E282 Polycystic ovarian syndrome: Secondary | ICD-10-CM

## 2024-07-23 ENCOUNTER — Other Ambulatory Visit

## 2024-07-30 ENCOUNTER — Ambulatory Visit: Admitting: "Endocrinology

## 2024-10-21 ENCOUNTER — Ambulatory Visit: Admitting: Obstetrics and Gynecology
